# Patient Record
Sex: Female | Born: 1995 | Race: Black or African American | Hispanic: No | Marital: Single | Smoking: Never smoker
Health system: Southern US, Community
[De-identification: ages and names within clinical notes are randomized; demographics above are authoritative.]

## PROBLEM LIST (undated history)

## (undated) DIAGNOSIS — I10 Essential (primary) hypertension: Secondary | ICD-10-CM

## (undated) DIAGNOSIS — E119 Type 2 diabetes mellitus without complications: Secondary | ICD-10-CM

## (undated) DIAGNOSIS — Z789 Other specified health status: Secondary | ICD-10-CM

## (undated) HISTORY — DX: Type 2 diabetes mellitus without complications: E11.9

## (undated) HISTORY — PX: NO PAST SURGERIES: SHX2092

## (undated) HISTORY — DX: Essential (primary) hypertension: I10

---

## 2016-06-09 ENCOUNTER — Emergency Department
Admit: 2016-06-09 | Disposition: A | Discharge status: Home / Self Care | Source: Home / Self Care | Attending: Emergency Medicine | Admitting: Emergency Medicine

## 2016-06-09 LAB — HX .ABORH NTOF RECHECK
CASE NUMBER: 2017197000673
HX ABORH NTOF RECHECK: O POS — NL
HX ANTI-A: 0 — NL
HX ANTI-B: 0 — NL

## 2016-06-09 LAB — HX GLOMERULAR FILTRATION RATE (ESTIMATED)
CASE NUMBER: 2017197000673
HX AFN AMER GLOMERULAR FILTRATION RATE: >90
HX NON-AFN AMER GLOMERULAR FILTRATION RATE: >90

## 2016-06-09 LAB — HX .AUTOMATED DIFF
CASE NUMBER: 2017197000673
HX ABSOLUTE NEUTRO COUNT: 1840 /mm3
HX BASOPHILS: 0 % — NL (ref 0.0–1.0)
HX EOSINOPHILS: 2 % — NL (ref 0.0–3.0)
HX IMMATURE GRANULOCYTES: 0 % — NL (ref 0.0–2.0)
HX LYMPHOCYTES: 54 % — ABNORMAL HIGH (ref 22.0–40.0)
HX MONOCYTES: 9 % — NL (ref 0.0–11.0)
HX NEU CT MEASURED: 1.84
HX NEUTROPHILS: 36 % — ABNORMAL LOW (ref 40.0–71.0)

## 2016-06-09 LAB — HX .ABO/RH TYPE TUBE
CASE NUMBER: 2017197000673
HX ABORH: O POS — NL
HX ANTI-A: 0 — NL
HX ANTI-B: 0 — NL

## 2016-06-09 LAB — HX COMPREHENSIVE METABOLIC PANEL
CASE NUMBER: 2017197000673
HX ALBUMIN LVL: 3.9 g/dL — NL (ref 3.5–5.0)
HX ALKALINE PHOSPHATASE: 49 U/L — NL (ref 45.0–117.0)
HX ALT: 13 U/L — NL (ref 6.0–78.0)
HX ANION GAP: 8 — NL (ref 3.0–11.0)
HX AST: 14 U/L — NL (ref 6.0–40.0)
HX BILIRUBIN TOTAL: 0.7 mg/dL — NL (ref 0.2–1.0)
HX BUN: 6 mg/dL — ABNORMAL LOW (ref 7.0–18.0)
HX CALCIUM LVL: 9 mg/dL — NL (ref 8.5–10.1)
HX CHLORIDE: 105 mmol/L — NL (ref 98.0–107.0)
HX CO2: 23 mmol/L — NL (ref 21.0–32.0)
HX CREATININE: 0.608 mg/dL — NL (ref 0.55–1.3)
HX GLUCOSE LVL: 100 mg/dL — NL (ref 65.0–110.0)
HX POTASSIUM LVL: 3.6 mmol/L — NL (ref 3.6–5.2)
HX SODIUM LVL: 136 mmol/L — NL (ref 136.0–145.0)
HX TOTAL PROTEIN: 8.3 g/dL — ABNORMAL HIGH (ref 6.0–8.0)

## 2016-06-09 LAB — HX HCG QUANTITATIVE
CASE NUMBER: 2017197000673
HX HCG QUANT: 12451 m[IU]/mL — ABNORMAL HIGH (ref 0.0–3.0)

## 2016-06-09 LAB — HX URINE DIPSTICK W/REFLEX
CASE NUMBER: 2017197000671
HX UA BILIRUBIN: NEGATIVE — NL
HX UA GLUCOSE: NEGATIVE — NL
HX UA KETONES: NEGATIVE — NL
HX UA NITRITE: NEGATIVE — NL
HX UA PH: 7 — NL (ref 5.0–8.0)
HX UA PROTEIN: NEGATIVE — NL
HX UA RBC: 3 /HPF — NL (ref 0.0–3.0)
HX UA SPECIFIC GRAVITY: 1.009 — NL (ref 1.005–1.03)
HX UA SQUAMOUS EPITHELIAL: 1 /HPF — NL (ref 0.0–5.0)
HX UA UROBILINOGEN: NEGATIVE — NL
HX UA WBC: 13 /HPF — ABNORMAL HIGH (ref 0.0–5.0)

## 2016-06-09 LAB — HX CBC W/ DIFF
CASE NUMBER: 2017197000673
HX HCT: 35.8 % — ABNORMAL LOW (ref 36.0–47.0)
HX HGB: 12.9 g/dL — NL (ref 11.8–15.8)
HX MCH: 31 pg — NL (ref 27.0–31.0)
HX MCHC: 36 g/dL — NL (ref 32.0–36.0)
HX MCV: 86 fL — NL (ref 81.0–99.0)
HX MPV: 9 fL — NL (ref 7.4–11.5)
HX NRBC PERCENT: 0 % — NL
HX PLATELET: 229 10*3/uL — NL (ref 150.0–400.0)
HX RBC: 4.17 10*6/uL — NL (ref 3.6–5.0)
HX RDW: 13 % — NL (ref 11.5–14.5)
HX WBC: 5.2 10*3/uL — NL (ref 3.7–11.2)

## 2016-06-09 LAB — HX ZZANTIBODY SCREEN
CASE NUMBER: 2017197000673
HX ANTIBODY SCREEN: NEGATIVE — NL
HX G1: 0 — NL
HX G2: 0 — NL
HX G3: 0 — NL

## 2016-06-09 LAB — HX BLUE TOP TO HOLD: CASE NUMBER: 2017197000684

## 2016-06-18 ENCOUNTER — Ambulatory Visit: Payer: Medicaid Other | Admitting: Family Medicine

## 2016-06-24 ENCOUNTER — Ambulatory Visit

## 2016-06-26 ENCOUNTER — Emergency Department
Admit: 2016-06-26 | Disposition: A | Discharge status: Home / Self Care | Source: Home / Self Care | Attending: Nurse Practitioner | Admitting: Nurse Practitioner

## 2016-06-26 LAB — HX COMPREHENSIVE METABOLIC PANEL
CASE NUMBER: 2017214002792
HX ALBUMIN LVL: 3.5 g/dL — NL (ref 3.5–5.0)
HX ALKALINE PHOSPHATASE: 52 U/L — NL (ref 45.0–117.0)
HX ALT: 17 U/L — NL (ref 6.0–78.0)
HX ANION GAP: 8 — NL (ref 3.0–11.0)
HX AST: 17 U/L — NL (ref 6.0–40.0)
HX BILIRUBIN TOTAL: 0.4 mg/dL — NL (ref 0.2–1.0)
HX BUN: 15 mg/dL — NL (ref 7.0–18.0)
HX CALCIUM LVL: 8.8 mg/dL — NL (ref 8.5–10.1)
HX CHLORIDE: 105 mmol/L — NL (ref 98.0–107.0)
HX CO2: 25 mmol/L — NL (ref 21.0–32.0)
HX CREATININE: 0.956 mg/dL — NL (ref 0.55–1.3)
HX GLUCOSE LVL: 95 mg/dL — NL (ref 65.0–110.0)
HX POTASSIUM LVL: 4 mmol/L — NL (ref 3.6–5.2)
HX SODIUM LVL: 138 mmol/L — NL (ref 136.0–145.0)
HX TOTAL PROTEIN: 8 g/dL — NL (ref 6.0–8.0)

## 2016-06-26 LAB — HX URINE DIPSTICK W/REFLEX
CASE NUMBER: 2017214002793
HX UA BILIRUBIN: NEGATIVE — NL
HX UA GLUCOSE: NEGATIVE — NL
HX UA KETONES: NEGATIVE — NL
HX UA NITRITE: NEGATIVE — NL
HX UA PH: 7 — NL (ref 5.0–8.0)
HX UA RBC: 8 /HPF — ABNORMAL HIGH (ref 0.0–3.0)
HX UA SPECIFIC GRAVITY: 1.018 — NL (ref 1.005–1.03)
HX UA SQUAMOUS EPITHELIAL: 7 /HPF — ABNORMAL HIGH (ref 0.0–5.0)
HX UA UROBILINOGEN: NEGATIVE — NL
HX UA WBC: 11 /HPF — ABNORMAL HIGH (ref 0.0–5.0)

## 2016-06-26 LAB — HX .AUTOMATED DIFF
CASE NUMBER: 2017214002791
HX ABSOLUTE NEUTRO COUNT: 2410 /mm3
HX BASOPHILS: 0 % — NL (ref 0.0–1.0)
HX EOSINOPHILS: 3 % — NL (ref 0.0–3.0)
HX IMMATURE GRANULOCYTES: 0 % — NL (ref 0.0–2.0)
HX LYMPHOCYTES: 52 % — ABNORMAL HIGH (ref 22.0–40.0)
HX MONOCYTES: 8 % — NL (ref 0.0–11.0)
HX NEU CT MEASURED: 2.41
HX NEUTROPHILS: 36 % — ABNORMAL LOW (ref 40.0–71.0)

## 2016-06-26 LAB — HX LIPASE LEVEL
CASE NUMBER: 2017214002792
HX LIPASE LVL: 190 U/L — NL (ref 73.0–393.0)

## 2016-06-26 LAB — HX CBC W/ DIFF
CASE NUMBER: 2017214002791
HX HCT: 33.9 % — ABNORMAL LOW (ref 36.0–47.0)
HX HGB: 12.1 g/dL — NL (ref 11.8–15.8)
HX MCH: 31 pg — NL (ref 27.0–31.0)
HX MCHC: 35.7 g/dL — NL (ref 32.0–36.0)
HX MCV: 86 fL — NL (ref 81.0–99.0)
HX MPV: 8.6 fL — NL (ref 7.4–11.5)
HX NRBC PERCENT: 0 % — NL
HX PLATELET: 211 10*3/uL — NL (ref 150.0–400.0)
HX RBC: 3.93 10*6/uL — NL (ref 3.6–5.0)
HX RDW: 12.3 % — NL (ref 11.5–14.5)
HX WBC: 6.7 10*3/uL — NL (ref 3.7–11.2)

## 2016-06-26 LAB — HX HCG QUANTITATIVE
CASE NUMBER: 2017214002792
HX HCG QUANT: 4682 m[IU]/mL — ABNORMAL HIGH (ref 0.0–3.0)

## 2016-06-26 LAB — HX BLUE TOP TO HOLD: CASE NUMBER: 2017214002792

## 2016-06-26 LAB — HX GLOMERULAR FILTRATION RATE (ESTIMATED)
CASE NUMBER: 2017214002792
HX AFN AMER GLOMERULAR FILTRATION RATE: >90
HX NON-AFN AMER GLOMERULAR FILTRATION RATE: 85 mL/min/{1.73_m2}

## 2016-06-26 LAB — HX SST GOLD TUBE TO HOLD: CASE NUMBER: 2017214002792

## 2016-06-26 LAB — HX BB TUBE TO HOLD: CASE NUMBER: 2017214002792

## 2016-06-27 ENCOUNTER — Ambulatory Visit: Admitting: Registered Nurse

## 2016-06-27 ENCOUNTER — Emergency Department
Admit: 2016-06-27 | Disposition: A | Discharge status: Home / Self Care | Source: Home / Self Care | Attending: Physician Assistant | Admitting: Physician Assistant

## 2016-06-27 LAB — HX URINE DIPSTICK W/REFLEX
CASE NUMBER: 2017215002483
HX UA BILIRUBIN: NEGATIVE — NL
HX UA GLUCOSE: NEGATIVE — NL
HX UA KETONES: NEGATIVE — NL
HX UA NITRITE: NEGATIVE — NL
HX UA PH: 6 — NL (ref 5.0–8.0)
HX UA RBC: 4 /HPF — ABNORMAL HIGH (ref 0.0–3.0)
HX UA SPECIFIC GRAVITY: 1.016 — NL (ref 1.005–1.03)
HX UA SQUAMOUS EPITHELIAL: 10 /HPF — ABNORMAL HIGH (ref 0.0–5.0)
HX UA UROBILINOGEN: NEGATIVE — NL
HX UA WBC: 13 /HPF — ABNORMAL HIGH (ref 0.0–5.0)

## 2016-06-27 LAB — HX HCG QUALITATIVE URINE
CASE NUMBER: 2017215002483
HX HCG QUAL URINE: POSITIVE — NL

## 2016-06-27 LAB — HX HCG QUANTITATIVE
CASE NUMBER: 2017215001858
HX HCG QUANT: 4720 m[IU]/mL — ABNORMAL HIGH (ref 0.0–3.0)

## 2016-06-28 LAB — HX URINE CULTURE
CASE NUMBER: 2017214002840
HX F: 40000
HX P: NO GROWTH

## 2016-06-29 LAB — HX URINE CULTURE
CASE NUMBER: 2017215002609
HX F: 50000
HX P: NO GROWTH

## 2016-06-30 ENCOUNTER — Emergency Department
Admit: 2016-06-30 | Disposition: A | Discharge status: Home / Self Care | Source: Home / Self Care | Attending: Emergency Medicine | Admitting: Emergency Medicine

## 2016-06-30 LAB — HX COMPREHENSIVE METABOLIC PANEL
CASE NUMBER: 2017218000499
HX ALBUMIN LVL: 3.4 g/dL — ABNORMAL LOW (ref 3.5–5.0)
HX ALKALINE PHOSPHATASE: 57 U/L — NL (ref 45.0–117.0)
HX ALT: 16 U/L — NL (ref 6.0–78.0)
HX ANION GAP: 7 — NL (ref 3.0–11.0)
HX AST: 17 U/L — NL (ref 6.0–40.0)
HX BILIRUBIN TOTAL: 0.4 mg/dL — NL (ref 0.2–1.0)
HX BUN: 14 mg/dL — NL (ref 7.0–18.0)
HX CALCIUM LVL: 9.1 mg/dL — NL (ref 8.5–10.1)
HX CHLORIDE: 107 mmol/L — NL (ref 98.0–107.0)
HX CO2: 25 mmol/L — NL (ref 21.0–32.0)
HX CREATININE: 0.529 mg/dL — ABNORMAL LOW (ref 0.55–1.3)
HX GLUCOSE LVL: 94 mg/dL — NL (ref 65.0–110.0)
HX POTASSIUM LVL: 3.8 mmol/L — NL (ref 3.6–5.2)
HX SODIUM LVL: 139 mmol/L — NL (ref 136.0–145.0)
HX TOTAL PROTEIN: 7.5 g/dL — NL (ref 6.0–8.0)

## 2016-06-30 LAB — HX HCG QUANTITATIVE
CASE NUMBER: 2017218000499
HX HCG QUANT: 2584 m[IU]/mL — ABNORMAL HIGH (ref 0.0–3.0)

## 2016-06-30 LAB — HX .AUTOMATED DIFF
CASE NUMBER: 2017218000499
HX ABSOLUTE NEUTRO COUNT: 2050 /mm3
HX BASOPHILS: 0 % — NL (ref 0.0–1.0)
HX EOSINOPHILS: 2 % — NL (ref 0.0–3.0)
HX IMMATURE GRANULOCYTES: 0 % — NL (ref 0.0–2.0)
HX LYMPHOCYTES: 53 % — ABNORMAL HIGH (ref 22.0–40.0)
HX MONOCYTES: 9 % — NL (ref 0.0–11.0)
HX NEU CT MEASURED: 2.05
HX NEUTROPHILS: 36 % — ABNORMAL LOW (ref 40.0–71.0)

## 2016-06-30 LAB — HX CBC W/ DIFF
CASE NUMBER: 2017218000499
HX HCT: 32.5 % — ABNORMAL LOW (ref 36.0–47.0)
HX HGB: 11.7 g/dL — ABNORMAL LOW (ref 11.8–15.8)
HX MCH: 31 pg — NL (ref 27.0–31.0)
HX MCHC: 36 g/dL — NL (ref 32.0–36.0)
HX MCV: 86 fL — NL (ref 81.0–99.0)
HX MPV: 8.8 fL — NL (ref 7.4–11.5)
HX NRBC PERCENT: 0 % — NL
HX PLATELET: 195 10*3/uL — NL (ref 150.0–400.0)
HX RBC: 3.76 10*6/uL — NL (ref 3.6–5.0)
HX RDW: 12.3 % — NL (ref 11.5–14.5)
HX WBC: 5.7 10*3/uL — NL (ref 3.7–11.2)

## 2016-06-30 LAB — HX GLOMERULAR FILTRATION RATE (ESTIMATED)
CASE NUMBER: 2017218000499
HX AFN AMER GLOMERULAR FILTRATION RATE: >90
HX NON-AFN AMER GLOMERULAR FILTRATION RATE: >90

## 2016-07-01 ENCOUNTER — Ambulatory Visit: Admitting: Obstetrics & Gynecology

## 2016-07-01 NOTE — Op Note (Signed)
__________________________________________________________________________    OPERATIVE REPORT  DATE:  07/01/2016    PREOPERATIVE DIAGNOSIS:  Incomplete abortion.    POSTOPERATIVE DIAGNOSIS:  Incomplete abortion.    PROCEDURE:  Dilatation and curettage.    SURGEON:  Delfina Redwood, M.D.    ASSISTANT:  None.    ANESTHESIA:  General.    ESTIMATED BLOOD LOSS:  50 mL.    INTAKE:  700 mL of crystalloid.    OUTPUT:  150 mL of clear yellow urine.    FINDINGS:  The uterus was slightly anteverted mid-plane and there was a large  amount of tissue consistent with products of conception.    DESCRIPTION OF PROCEDURE:  The patient was taken to the Operating Room where she  was given general anesthetic.  She was then placed in the low lithotomy  position.  She was prepped and draped in the usual sterile fashion.  The urinary  bladder was straight catheterized for 150 mL of clear yellow urine.  A Sims  speculum was placed into the vagina.  The cervix was visualized and grasped on  its anterior lip with a single-tooth tenaculum.  The cervix was noted to be  already widely dilated with clot at the os.  A 20-Pratt dilator was gently  inserted and the cervix was already dilated beyond this.  After this, an 8-mm  curved suction curette was used to evacuate the contents of the uterus.  Two  passes were done.  After this, a very gentle sharp curettage was done, just to  feel that the surfaces felt cleared.  One additional pass with the suction was  then done and there was no additional tissue.  The tenaculum was removed.  The  site was noted to be hemostatic.  There was a very small amount of bleeding from  the os.  All instruments were withdrawn and the patient was awakened and taken  to Recovery in good condition.    Dictated by:  Delfina Redwood, M.D.    D:  07/01/2016 15:07:50  T:  07/01/2016 16:09:18  E:  07/01/2016 16:09:18  ME/jf  Job# [id number]   SIGNATURE LINE    Electronically signed by Vernie Ammons MD, Carrel Leather on 07/01/2016 at  16:26:21 EST

## 2016-07-03 LAB — HX SURG PATH FINAL REPORT: CASE NUMBER: 0

## 2016-07-18 ENCOUNTER — Ambulatory Visit

## 2016-07-18 LAB — HX HCG QUANTITATIVE
CASE NUMBER: 2017236000633
HX HCG QUANT: 3 m[IU]/mL — NL (ref 0.0–3.0)

## 2016-08-30 ENCOUNTER — Ambulatory Visit: Admitting: Registered Nurse

## 2016-08-30 LAB — HX COMPREHENSIVE METABOLIC PANEL
CASE NUMBER: 2017279002256
HX ALBUMIN LVL: 3.8 g/dL — NL (ref 3.5–5.0)
HX ALKALINE PHOSPHATASE: 61 U/L — NL (ref 45.0–117.0)
HX ALT: 18 U/L — NL (ref 6.0–78.0)
HX ANION GAP: 7 — NL (ref 3.0–11.0)
HX AST: 21 U/L — NL (ref 6.0–40.0)
HX BILIRUBIN TOTAL: 0.4 mg/dL — NL (ref 0.2–1.2)
HX BUN: 8 mg/dL — NL (ref 7.0–18.0)
HX CALCIUM LVL: 9.2 mg/dL — NL (ref 8.5–10.1)
HX CHLORIDE: 104 mmol/L — NL (ref 98.0–110.0)
HX CO2: 27 mmol/L — NL (ref 21.0–32.0)
HX CREATININE: 0.483 mg/dL — ABNORMAL LOW (ref 0.55–1.3)
HX GLUCOSE LVL: 87 mg/dL — NL (ref 65.0–110.0)
HX POTASSIUM LVL: 3.4 mmol/L — ABNORMAL LOW (ref 3.6–5.2)
HX SODIUM LVL: 138 mmol/L — NL (ref 136.0–145.0)
HX TOTAL PROTEIN: 8.6 g/dL — ABNORMAL HIGH (ref 6.0–8.0)

## 2016-08-30 LAB — HX HIV 1/2 ANTIGEN/ANTIBODY COMBINATION ASS
CASE NUMBER: 2017279002257
HX HIV-1/HIV-2 AG/AB COMBO SCREEN: NONREACTIVE

## 2016-08-30 LAB — HX GLOMERULAR FILTRATION RATE (ESTIMATED)
CASE NUMBER: 2017279002256
HX AFN AMER GLOMERULAR FILTRATION RATE: >90
HX NON-AFN AMER GLOMERULAR FILTRATION RATE: >90

## 2016-08-30 LAB — HX HEPATITIS C ANTIBODY
CASE NUMBER: 2017279002256
HX HEP C AB INST: NONREACTIVE
HX HEP C AB MEASURED: 0.07 — NL (ref 0.0–0.8)
HX HEP C AB: NONREACTIVE

## 2016-08-30 LAB — HX LIPID PANEL
CASE NUMBER: 2017279002256
HX CHOL: 141 mg/dL — NL (ref 140.0–200.0)
HX HDL: 59 mg/dL — NL (ref 41.0–60.0)
HX LDL: 43 mg/dL — NL (ref 0.0–129.0)
HX TRIG: 195 mg/dL — ABNORMAL HIGH (ref 0.0–149.0)

## 2016-08-31 LAB — HX HEMOGLOBIN A1C
CASE NUMBER: 2017279002256
HX EST AVERAGE GLUCOSE (EAG): 80 mg/dL
HX HEMOGLOBIN A1C: 4.4 % — NL
HX LA1C (INTERNAL): 1.6 % — NL
HX P3 PEAK (INTERNAL): 2.8 % — NL
HX P4 PEAK (INTERNAL): 1.2 % — NL
HX TOTAL AREA RANGE (INTERNAL): 1.73 microvolt/sec — NL (ref 1.0–3.5)

## 2016-09-01 LAB — HX RPR
CASE NUMBER: 2017279002256
HX RPR QUAL: NONREACTIVE — NL

## 2016-09-02 LAB — HX SEXUALLY TRAN DIS (AMP PRB)
CASE NUMBER: 2017279002258
HX C TRACHOMATIS DNA: NOT DETECTED — NL
HX N. GONORRHOEAE DNA: NOT DETECTED — NL
HX TOTAL RLU: 9

## 2016-09-21 ENCOUNTER — Emergency Department: Admit: 2016-09-21 | Disposition: A | Discharge status: Home / Self Care | Source: Home / Self Care

## 2016-09-21 LAB — HX CBC W/ DIFF
CASE NUMBER: 2017301000846
HX HCT: 34.7 % — ABNORMAL LOW (ref 36.0–47.0)
HX HGB: 12.2 g/dL — NL (ref 11.8–15.8)
HX MCH: 29 pg — NL (ref 27.0–31.0)
HX MCHC: 35.2 g/dL — NL (ref 32.0–36.0)
HX MCV: 84 fL — NL (ref 81.0–99.0)
HX MPV: 8.8 fL — NL (ref 7.4–11.5)
HX NRBC PERCENT: 0 % — NL
HX PLATELET: 249 10*3/uL — NL (ref 150.0–400.0)
HX RBC: 4.15 10*6/uL — NL (ref 3.6–5.0)
HX RDW: 12.4 % — NL (ref 11.5–14.5)
HX WBC: 4.8 10*3/uL — NL (ref 3.7–11.2)

## 2016-09-21 LAB — HX URINE DIPSTICK W/REFLEX
CASE NUMBER: 2017301000847
HX UA BILIRUBIN: NEGATIVE — NL
HX UA BLOOD: NEGATIVE — NL
HX UA GLUCOSE: NEGATIVE — NL
HX UA KETONES: NEGATIVE — NL
HX UA NITRITE: NEGATIVE — NL
HX UA PH: 7 — NL (ref 5.0–8.0)
HX UA PROTEIN: NEGATIVE — NL
HX UA RBC: 2 /HPF — NL (ref 0.0–3.0)
HX UA SPECIFIC GRAVITY: 1.016 — NL (ref 1.005–1.03)
HX UA SQUAMOUS EPITHELIAL: 9 /HPF — ABNORMAL HIGH (ref 0.0–5.0)
HX UA UROBILINOGEN: NEGATIVE — NL
HX UA WBC: 7 /HPF — ABNORMAL HIGH (ref 0.0–5.0)

## 2016-09-21 LAB — HX .AUTOMATED DIFF
CASE NUMBER: 2017301000846
HX ABSOLUTE NEUTRO COUNT: 1970 /mm3
HX BASOPHILS: 1 % — NL (ref 0.0–1.0)
HX EOSINOPHILS: 2 % — NL (ref 0.0–3.0)
HX IMMATURE GRANULOCYTES: 0 % — NL (ref 0.0–2.0)
HX LYMPHOCYTES: 48 % — ABNORMAL HIGH (ref 22.0–40.0)
HX MONOCYTES: 8 % — NL (ref 0.0–11.0)
HX NEU CT MEASURED: 1.97
HX NEUTROPHILS: 41 % — NL (ref 40.0–71.0)

## 2016-09-21 LAB — HX COMPREHENSIVE METABOLIC PANEL
CASE NUMBER: 2017301000850
HX ALBUMIN LVL: 3.5 g/dL — NL (ref 3.5–5.0)
HX ALKALINE PHOSPHATASE: 56 U/L — NL (ref 45.0–117.0)
HX ALT: 13 U/L — NL (ref 6.0–78.0)
HX ANION GAP: 7 — NL (ref 3.0–11.0)
HX AST: 18 U/L — NL (ref 6.0–40.0)
HX BILIRUBIN TOTAL: 0.4 mg/dL — NL (ref 0.2–1.2)
HX BUN: 10 mg/dL — NL (ref 7.0–18.0)
HX CALCIUM LVL: 8.8 mg/dL — NL (ref 8.5–10.1)
HX CHLORIDE: 105 mmol/L — NL (ref 98.0–110.0)
HX CO2: 25 mmol/L — NL (ref 21.0–32.0)
HX CREATININE: 0.52 mg/dL — ABNORMAL LOW (ref 0.55–1.3)
HX GLUCOSE LVL: 99 mg/dL — NL (ref 65.0–110.0)
HX POTASSIUM LVL: 3.9 mmol/L — NL (ref 3.6–5.2)
HX SODIUM LVL: 137 mmol/L — NL (ref 136.0–145.0)
HX TOTAL PROTEIN: 7.8 g/dL — NL (ref 6.0–8.0)

## 2016-09-21 LAB — HX C-REACTIVE PROTEIN (CRP)
CASE NUMBER: 2017301000846
HX C-REACTIVE PROTEIN: <0.3 — NL (ref 0.0–0.8)

## 2016-09-21 LAB — HX GLOMERULAR FILTRATION RATE (ESTIMATED)
CASE NUMBER: 2017301000850
HX AFN AMER GLOMERULAR FILTRATION RATE: >90
HX NON-AFN AMER GLOMERULAR FILTRATION RATE: >90

## 2016-09-21 LAB — HX SEDIMENTATION RATE
CASE NUMBER: 2017301000846
HX SED RATE: 42 mm/h — ABNORMAL HIGH (ref 0.0–20.0)

## 2016-09-21 LAB — HX HCG QUANTITATIVE
CASE NUMBER: 2017301000850
HX HCG QUANT: 44 m[IU]/mL — ABNORMAL HIGH (ref 0.0–3.0)

## 2016-10-04 ENCOUNTER — Ambulatory Visit: Admitting: Women's Health

## 2016-10-04 LAB — HX HCG QUANTITATIVE
CASE NUMBER: 2017314002176
HX HCG QUANT: 15797 m[IU]/mL — ABNORMAL HIGH (ref 0.0–3.0)

## 2016-10-11 ENCOUNTER — Ambulatory Visit: Admitting: Women's Health

## 2016-10-22 ENCOUNTER — Ambulatory Visit: Admitting: Women's Health

## 2016-10-23 LAB — HX URINE CULTURE
CASE NUMBER: 2017332002840
HX F: >100000

## 2016-11-04 ENCOUNTER — Emergency Department
Admit: 2016-11-04 | Disposition: A | Discharge status: Home / Self Care | Source: Home / Self Care | Attending: Nurse Practitioner | Admitting: Nurse Practitioner

## 2016-11-04 LAB — HX URINE DIPSTICK W/REFLEX
CASE NUMBER: 2017345002093
HX UA BILIRUBIN: NEGATIVE — NL
HX UA GLUCOSE: NEGATIVE — NL
HX UA KETONES: NEGATIVE — NL
HX UA NITRITE: NEGATIVE — NL
HX UA PH: 6 — NL (ref 5.0–8.0)
HX UA RBC: 10 /HPF — ABNORMAL HIGH (ref 0.0–3.0)
HX UA SPECIFIC GRAVITY: 1.033 — ABNORMAL HIGH (ref 1.005–1.03)
HX UA SQUAMOUS EPITHELIAL: 17 /HPF — ABNORMAL HIGH (ref 0.0–5.0)
HX UA UROBILINOGEN: 2 — AB
HX UA WBC: 9 /HPF — ABNORMAL HIGH (ref 0.0–5.0)

## 2016-11-04 LAB — HX COMPREHENSIVE METABOLIC PANEL
CASE NUMBER: 2017345002092
HX ALBUMIN LVL: 3.6 g/dL — NL (ref 3.5–5.0)
HX ALKALINE PHOSPHATASE: 48 U/L — NL (ref 45.0–117.0)
HX ALT: 111 U/L — ABNORMAL HIGH (ref 6.0–78.0)
HX ANION GAP: 8 — NL (ref 3.0–11.0)
HX AST: 85 U/L — ABNORMAL HIGH (ref 6.0–40.0)
HX BILIRUBIN TOTAL: 0.6 mg/dL — NL (ref 0.2–1.2)
HX BUN: 7 mg/dL — NL (ref 7.0–18.0)
HX CALCIUM LVL: 9.6 mg/dL — NL (ref 8.5–10.1)
HX CHLORIDE: 104 mmol/L — NL (ref 98.0–110.0)
HX CO2: 25 mmol/L — NL (ref 21.0–32.0)
HX CREATININE: 0.445 mg/dL — ABNORMAL LOW (ref 0.55–1.3)
HX GLUCOSE LVL: 75 mg/dL — NL (ref 65.0–110.0)
HX POTASSIUM LVL: 3.8 mmol/L — NL (ref 3.6–5.2)
HX SODIUM LVL: 137 mmol/L — NL (ref 136.0–145.0)
HX TOTAL PROTEIN: 8.6 g/dL — ABNORMAL HIGH (ref 6.0–8.0)

## 2016-11-04 LAB — HX .AUTOMATED DIFF
CASE NUMBER: 2017345002092
HX ABSOLUTE NEUTRO COUNT: 2790 /mm3
HX BASOPHILS: 0 % — NL (ref 0.0–1.0)
HX EOSINOPHILS: 1 % — NL (ref 0.0–3.0)
HX IMMATURE GRANULOCYTES: 0 % — NL (ref 0.0–2.0)
HX LYMPHOCYTES: 40 % — NL (ref 22.0–40.0)
HX MONOCYTES: 9 % — NL (ref 0.0–11.0)
HX NEU CT MEASURED: 2.79
HX NEUTROPHILS: 49 % — NL (ref 40.0–71.0)

## 2016-11-04 LAB — HX GLOMERULAR FILTRATION RATE (ESTIMATED)
CASE NUMBER: 2017345002092
HX AFN AMER GLOMERULAR FILTRATION RATE: >90
HX NON-AFN AMER GLOMERULAR FILTRATION RATE: >90

## 2016-11-04 LAB — HX CBC W/ DIFF
CASE NUMBER: 2017345002092
HX HCT: 37.1 % — NL (ref 36.0–47.0)
HX HGB: 12.8 g/dL — NL (ref 11.8–15.8)
HX MCH: 29 pg — NL (ref 27.0–31.0)
HX MCHC: 34.5 g/dL — NL (ref 32.0–36.0)
HX MCV: 84 fL — NL (ref 81.0–99.0)
HX MPV: 8.8 fL — NL (ref 7.4–11.5)
HX NRBC PERCENT: 0 % — NL
HX PLATELET: 282 10*3/uL — NL (ref 150.0–400.0)
HX RBC: 4.41 10*6/uL — NL (ref 3.6–5.0)
HX RDW: 13.5 % — NL (ref 11.5–14.5)
HX WBC: 5.6 10*3/uL — NL (ref 3.7–11.2)

## 2016-11-04 LAB — HX BLUE TOP TO HOLD: CASE NUMBER: 2017345002092

## 2016-11-04 NOTE — ED Notes (Signed)
Please click on link to see document

## 2016-11-06 LAB — HX URINE CULTURE
CASE NUMBER: 2017345002542
HX F: >100000
HX P: 30000

## 2016-11-14 ENCOUNTER — Ambulatory Visit: Admitting: Registered Nurse

## 2016-11-18 LAB — HX QUANTIFERON TB GOLD
CASE NUMBER: 2017355002435
HX QUANTIFERON NIL: 0.18 [IU]/mL
HX QUANTIFERON TB GOLD: NEGATIVE
HX QUANTIFERON TB MINUS NIL: 0.32 [IU]/mL
HX QUANTIFERON TB MITOGEN MINUS NIL: 3.78 [IU]/mL

## 2016-11-19 ENCOUNTER — Ambulatory Visit: Admitting: Registered Nurse

## 2016-11-19 LAB — HX CBC W/ INDICES
CASE NUMBER: 2017360002238
HX HCT: 33.9 % — ABNORMAL LOW (ref 36.0–47.0)
HX HGB: 11.9 g/dL — NL (ref 11.8–15.8)
HX MCH: 30 pg — NL (ref 27.0–31.0)
HX MCHC: 35.1 g/dL — NL (ref 32.0–36.0)
HX MCV: 86 fL — NL (ref 81.0–99.0)
HX MPV: 9.5 fL — NL (ref 7.4–11.5)
HX NRBC PERCENT: 0 % — NL
HX PLATELET: 254 10*3/uL — NL (ref 150.0–400.0)
HX RBC: 3.95 10*6/uL — NL (ref 3.6–5.0)
HX RDW: 13.3 % — NL (ref 11.5–14.5)
HX WBC: 5.4 10*3/uL — NL (ref 3.7–11.2)

## 2016-11-19 LAB — HX .ABO/RH TYPE TUBE
CASE NUMBER: 2017360002238
HX ABORH: O POS — NL
HX ANTI-A: 0 — NL
HX ANTI-B: 0 — NL

## 2016-11-19 LAB — HX HEPATITIS C ANTIBODY
CASE NUMBER: 2017360002238
HX HEP C AB INST: NONREACTIVE
HX HEP C AB MEASURED: 0.11 — NL (ref 0.0–0.8)
HX HEP C AB: NONREACTIVE

## 2016-11-19 LAB — HX ZZANTIBODY SCREEN
CASE NUMBER: 2017360002238
HX ANTIBODY SCREEN: NEGATIVE — NL
HX G1: 0 — NL
HX G2: 0 — NL
HX G3: 0 — NL

## 2016-11-19 LAB — HX RUBELLA ANTIBODY IGG
CASE NUMBER: 2017360002238
HX RUBELLA IGG: 51.9 [IU]/mL

## 2016-11-19 LAB — HX HEPATITIS B SURFACE ANTIGEN PRENATAL
CASE NUMBER: 2017360002238
HX HBSAG MEASURED: <0.1 — NL (ref 0.0–0.99)
HX HEP B SURFACE AG PRENATAL: NEGATIVE
HX HEP BS AG INST: NONREACTIVE

## 2016-11-19 LAB — HX HIV 1/2 ANTIGEN/ANTIBODY COMBINATION ASS
CASE NUMBER: 2017360002239
HX HIV-1/HIV-2 AG/AB COMBO SCREEN: NONREACTIVE

## 2016-11-20 LAB — HX SEXUALLY TRAN DIS (AMP PRB)
CASE NUMBER: 2017360002755
HX C TRACHOMATIS DNA: NOT DETECTED — NL
HX N. GONORRHOEAE DNA: NOT DETECTED — NL
HX TOTAL RLU: 7

## 2016-11-20 LAB — HX RPR
CASE NUMBER: 2017360002238
HX RPR QUAL: NONREACTIVE — NL

## 2016-11-21 LAB — HX ZZYYDRUGS OF ABUSE PANEL 9, URINE
CASE NUMBER: 2017360002755
HX U AMPHETAMINES SCR: NEGATIVE
HX U METHADONE SCR: NEGATIVE
HX URINE BARBITURATES SCREEN: NEGATIVE
HX URINE BENZODIAZEPINES SCREE: NEGATIVE
HX URINE CANNABINOID SCREEN: NEGATIVE
HX URINE COCAINE SCREEN: NEGATIVE
HX URINE CREAT (DAU): 299.6 mg/dL
HX URINE OPIATES SCREEN: NEGATIVE
HX URINE PHENCYCLIDINE SCREEN: NEGATIVE
HX URINE PROPOXYPHENE SCREEN: NEGATIVE

## 2016-12-17 ENCOUNTER — Ambulatory Visit: Admitting: Registered Nurse

## 2016-12-19 LAB — HX AFP QUAD SCREEN
CASE NUMBER: 2018023002984
HX DIMER INHIBIN A: 113 pg/mL
HX GEST AGE (EXACT): 15.71
HX MATERNAL AFP: 54 ng/mL
HX MATERNAL AGE AT DELIVERY: 23.5
HX MATERNAL SCREEN INTERPRETATION: NORMAL
HX MATERNAL WEIGHT: 141
HX MOM DIA: 0.68
HX MOM FOR AFP: 1.5
HX MOM FOR HCG: 1.4
HX MOM FOR UNCONJUGATED E3: 0.96
HX PATIENT'S HCG: 60140 [IU]/L
HX UNCONJUGATED E3: 0.79 ng/mL

## 2017-01-08 ENCOUNTER — Ambulatory Visit: Admitting: Registered Nurse

## 2017-02-11 ENCOUNTER — Ambulatory Visit: Admitting: Women's Health

## 2017-03-13 ENCOUNTER — Ambulatory Visit: Admitting: Obstetrics & Gynecology

## 2017-03-13 LAB — HX CBC W/ DIFF
CASE NUMBER: 2018109002387
HX HCT: 27.6 % — ABNORMAL LOW (ref 36.0–47.0)
HX HGB: 9.3 g/dL — ABNORMAL LOW (ref 11.8–15.8)
HX MCH: 31 pg — NL (ref 27.0–31.0)
HX MCHC: 33.7 g/dL — NL (ref 32.0–36.0)
HX MCV: 92 fL — NL (ref 81.0–99.0)
HX MPV: 9.9 fL — NL (ref 7.4–11.5)
HX NRBC PERCENT: 0 % — NL
HX PLATELET: 213 10*3/uL — NL (ref 150.0–400.0)
HX RBC: 2.99 10*6/uL — ABNORMAL LOW (ref 3.6–5.0)
HX RDW: 13.5 % — NL (ref 11.5–14.5)
HX WBC: 6.1 10*3/uL — NL (ref 3.7–11.2)

## 2017-03-13 LAB — HX .AUTOMATED DIFF
CASE NUMBER: 2018109002387
HX ABSOLUTE NEUTRO COUNT: 3460 /mm3
HX BASOPHILS: 0 % — NL (ref 0.0–1.0)
HX EOSINOPHILS: 2 % — NL (ref 0.0–3.0)
HX IMMATURE GRANULOCYTES: 1 % — NL (ref 0.0–2.0)
HX LYMPHOCYTES: 32 % — NL (ref 22.0–40.0)
HX MONOCYTES: 9 % — NL (ref 0.0–11.0)
HX NEU CT MEASURED: 3.46
HX NEUTROPHILS: 57 % — NL (ref 40.0–71.0)

## 2017-03-13 LAB — HX .GLU 1 HR PRENATAL 50 G
CASE NUMBER: 2018109002387
HX GLUC 1 HR PRENATAL: 99 mg/dL — NL (ref 65.0–129.0)

## 2017-03-13 LAB — HX HIV 1/2 ANTIGEN/ANTIBODY COMBINATION ASS
CASE NUMBER: 2018109002388
HX HIV-1/HIV-2 AG/AB COMBO SCREEN: NONREACTIVE

## 2017-05-09 ENCOUNTER — Ambulatory Visit: Admitting: Internal Medicine

## 2017-05-11 LAB — HX VAG/REC GROUP B BY PCR
CASE NUMBER: 2018166002607
HX GROUP B STREP BY PCR: DETECTED — AB

## 2017-06-03 ENCOUNTER — Ambulatory Visit

## 2017-06-03 ENCOUNTER — Inpatient Hospital Stay
Admit: 2017-06-03 | Disposition: A | Discharge status: Home / Self Care | Source: Ambulatory Visit | Attending: Addiction Medicine | Admitting: Addiction Medicine

## 2017-06-03 LAB — HX .ABO/RH TYPE TUBE
CASE NUMBER: 2018191000293
HX ABORH: O POS — NL
HX ANTI-A: 0 — NL
HX ANTI-B: 0 — NL

## 2017-06-03 LAB — HX ZZANTIBODY SCREEN
CASE NUMBER: 2018191000293
HX ANTIBODY SCREEN: NEGATIVE — NL
HX G1: 0 — NL
HX G2: 0 — NL
HX G3: 0 — NL

## 2017-06-03 LAB — HX CBC W/ INDICES
CASE NUMBER: 2018191000293
HX HCT: 33.9 % — ABNORMAL LOW (ref 36.0–47.0)
HX HGB: 11.2 g/dL — ABNORMAL LOW (ref 11.8–15.8)
HX MCH: 29 pg — NL (ref 27.0–31.0)
HX MCHC: 33 g/dL — NL (ref 32.0–36.0)
HX MCV: 87 fL — NL (ref 81.0–99.0)
HX MPV: 9.4 fL — NL (ref 7.4–11.5)
HX NRBC PERCENT: 0 % — NL
HX PLATELET: 260 10*3/uL — NL (ref 150.0–400.0)
HX RBC: 3.91 10*6/uL — NL (ref 3.6–5.0)
HX RDW: 14.6 % — ABNORMAL HIGH (ref 11.5–14.5)
HX WBC: 7.3 10*3/uL — NL (ref 3.7–11.2)

## 2017-06-03 NOTE — H&P (Signed)
__________________________________________________________________________    HISTORY AND PHYSICAL  ADMITTED:  06/03/2017    CHIEF COMPLAINT:  "I am having contractions."    HISTORY OF PRESENT ILLNESS:  This is a 21 year old G2 P0-0-1-0 at 39 weeks and 5  days admitted in early labor, 3.5 cm of cervical dilation, 100% effaced and 0  station.  Endorses fetal movement.  Endorses contractions and denies leakage of  fluid or vaginal bleeding.  Pregnancy complicated.    Obstetric Issues:  1.  Hyperemesis.  2.  Labial lesion that has resolved.  She had a Bartholin's cyst that was  drained during the pregnancy and the patient was started on antibiotics in  March, currently has resolved.  3.  Anemia.  The patient taking iron and Colace.  4.  SAB.  Second pregnancy is current.    GYN HISTORY:  Denies.    FAMILY HISTORY:  Noncontributory.    PAST SURGICAL HISTORY:  None.    SOCIAL HISTORY:  Denies alcohol or tobacco use.    ALLERGIES:  No known drug allergies.    MEDICATIONS:  1.  Prenatal vitamins.  2.  Iron.  3.  Colace.    REVIEW OF SYSTEMS:  Unremarkable.    PHYSICAL EXAMINATION:  VITAL SIGNS:  Stable.  Afebrile.    CHEST:  Lungs clear to auscultation bilaterally.    HEART:  S1 and S2 normal.    ABDOMEN:  Soft, nontender and nondistended.    PELVIC:  3.5 cm dilated and 70% effaced, 0 station, vertex intact.  Fetal heart  tracing category 1, contractions two to three minutes.  Adequate pelvis.   Estimated fetal weight 3500 grams.    LABORATORY DATA:  HIV negative.  Hepatitis B surface antigen negative.  Rubella  immune.  RPR nonreactive.  GBS positive, O positive blood type.  Hemoglobin  11.2.  Hematocrit 33.9.  Platelets 260.  One hour OGTT passed.  Quad screen  normal.  Gonorrhea and chlamydia negative.  GOLD QuantiFERON negative.    IMPRESSION:  This is a 21 year old G2 P0-0-1-0 at 39 weeks and 5 days in early  labor.    PLAN:    1.  Pitocin if needed and epidural if required.  2.  Expectant management for  now.    Dictated by:  Debara Pickett, M.D.    D:  06/03/2017 07:56:39  T:  06/03/2017 16:10:96  E:  06/03/2017 04:54:09  PDK/par  Job# [id number]   SIGNATURE LINE    Electronically signed by Marcello Moores MD, Etsuko Dierolf on 06/04/2017 at 09:54:15 EST

## 2017-06-04 LAB — HX BLOOD GAS ARTERIAL CORD
CASE NUMBER: 2018192000455
HX BASE EXCESS ARTERIAL CORD: -5.3 mmol/L — NL
HX FIO2 CONC: 21
HX HCO3 ARTERIAL CORD: 17.1 mmol/L — NL (ref 17.0–24.0)
HX O2 SAT ARTERIAL CORD: 12.3 % — NL (ref 7.0–21.0)
HX PCO2 ARTERIAL CORD: 62 mmHg — ABNORMAL HIGH (ref 33.0–49.0)
HX PH ARTERIAL CORD: 7.18 — ABNORMAL LOW (ref 7.21–7.31)
HX PO2 ARTERIAL CORD: <13 — NL (ref 9.0–19.0)

## 2017-06-04 LAB — HX BLOOD GAS VENOUS
CASE NUMBER: 2018192000454
HX FIO2 CONC: 21
HX HCO3 VENOUS: 17.8 mmol/L — ABNORMAL LOW (ref 22.0–26.0)
HX O2 SAT VENOUS: 43.2 % — ABNORMAL LOW (ref 50.0–70.0)
HX PCO2 VENOUS: 44.2 mmHg — NL (ref 41.0–51.0)
HX PH VENOUS: 7.267 — ABNORMAL LOW (ref 7.32–7.42)
HX PO2 VENOUS: <10

## 2017-06-05 LAB — HX .AUTOMATED DIFF
CASE NUMBER: 2018193000360
HX ABSOLUTE NEUTRO COUNT: 10510 /mm3
HX BASOPHILS: 0 % — NL (ref 0.0–1.0)
HX EOSINOPHILS: 0 % — NL (ref 0.0–3.0)
HX IMMATURE GRANULOCYTES: 1 % — NL (ref 0.0–2.0)
HX LYMPHOCYTES: 21 % — ABNORMAL LOW (ref 22.0–40.0)
HX MONOCYTES: 6 % — NL (ref 0.0–11.0)
HX NEU CT MEASURED: 10.51
HX NEUTROPHILS: 73 % — ABNORMAL HIGH (ref 40.0–71.0)

## 2017-06-05 LAB — HX CBC W/ DIFF
CASE NUMBER: 2018193000360
HX HCT: 24.2 % — ABNORMAL LOW (ref 36.0–47.0)
HX HGB: 7.9 g/dL — ABNORMAL LOW (ref 11.8–15.8)
HX MCH: 28 pg — NL (ref 27.0–31.0)
HX MCHC: 32.6 g/dL — NL (ref 32.0–36.0)
HX MCV: 87 fL — NL (ref 81.0–99.0)
HX MPV: 9.3 fL — NL (ref 7.4–11.5)
HX NRBC PERCENT: 0 % — NL
HX PLATELET: 203 10*3/uL — NL (ref 150.0–400.0)
HX RBC: 2.77 10*6/uL — ABNORMAL LOW (ref 3.6–5.0)
HX RDW: 14.8 % — ABNORMAL HIGH (ref 11.5–14.5)
HX WBC: 14.5 10*3/uL — ABNORMAL HIGH (ref 3.7–11.2)

## 2017-06-06 LAB — HX SURG PATH FINAL REPORT
CASE NUMBER: 0
HX NOTE: 88307

## 2017-06-10 ENCOUNTER — Ambulatory Visit: Admitting: Family

## 2017-06-10 LAB — HX CBC W/ DIFF
CASE NUMBER: 2018198002270
HX HCT: 25.8 % — ABNORMAL LOW (ref 36.0–47.0)
HX HGB: 8.4 g/dL — ABNORMAL LOW (ref 11.8–15.8)
HX MCH: 29 pg — NL (ref 27.0–31.0)
HX MCHC: 32.6 g/dL — NL (ref 32.0–36.0)
HX MCV: 88 fL — NL (ref 81.0–99.0)
HX MPV: 9.1 fL — NL (ref 7.4–11.5)
HX NRBC PERCENT: 0 % — NL
HX PLATELET: 328 10*3/uL — NL (ref 150.0–400.0)
HX RBC: 2.93 10*6/uL — ABNORMAL LOW (ref 3.6–5.0)
HX RDW: 14.4 % — NL (ref 11.5–14.5)
HX WBC: 8.3 10*3/uL — NL (ref 3.7–11.2)

## 2017-06-10 LAB — HX COMPREHENSIVE METABOLIC PANEL
CASE NUMBER: 2018198002270
HX ALBUMIN LVL: 2.7 g/dL — ABNORMAL LOW (ref 3.5–5.0)
HX ALKALINE PHOSPHATASE: 118 U/L — ABNORMAL HIGH (ref 45.0–117.0)
HX ALT: 20 U/L — NL (ref 6.0–78.0)
HX ANION GAP: 8 — NL (ref 3.0–11.0)
HX AST: 26 U/L — NL (ref 6.0–40.0)
HX BILIRUBIN TOTAL: 0.4 mg/dL — NL (ref 0.2–1.2)
HX BUN: 6 mg/dL — ABNORMAL LOW (ref 7.0–18.0)
HX CALCIUM LVL: 8.9 mg/dL — NL (ref 8.5–10.1)
HX CHLORIDE: 106 mmol/L — NL (ref 98.0–110.0)
HX CO2: 25 mmol/L — NL (ref 21.0–32.0)
HX CREATININE: 0.411 mg/dL — ABNORMAL LOW (ref 0.55–1.3)
HX GLUCOSE LVL: 63 mg/dL — ABNORMAL LOW (ref 65.0–110.0)
HX POTASSIUM LVL: 4 mmol/L — NL (ref 3.6–5.2)
HX SODIUM LVL: 139 mmol/L — NL (ref 136.0–145.0)
HX TOTAL PROTEIN: 7.1 g/dL — NL (ref 6.0–8.0)

## 2017-06-10 LAB — HX GLOMERULAR FILTRATION RATE (ESTIMATED)
CASE NUMBER: 2018198002270
HX AFN AMER GLOMERULAR FILTRATION RATE: >90
HX NON-AFN AMER GLOMERULAR FILTRATION RATE: >90

## 2017-06-10 LAB — HX .AUTOMATED DIFF
CASE NUMBER: 2018198002270
HX ABSOLUTE NEUTRO COUNT: 4430 /mm3
HX BASOPHILS: 0 % — NL (ref 0.0–1.0)
HX EOSINOPHILS: 1 % — NL (ref 0.0–3.0)
HX IMMATURE GRANULOCYTES: 1 % — NL (ref 0.0–2.0)
HX LYMPHOCYTES: 37 % — NL (ref 22.0–40.0)
HX MONOCYTES: 8 % — NL (ref 0.0–11.0)
HX NEU CT MEASURED: 4.43
HX NEUTROPHILS: 54 % — NL (ref 40.0–71.0)

## 2017-06-10 LAB — HX PROTEIN/ CREATININE RATIO
CASE NUMBER: 2018198002271
HX U CREAT: 97.1 mg/dL
HX U PROT/CR RATIO: 0.47
HX U PROTEIN: 45.2 mg/dL

## 2017-10-24 DIAGNOSIS — O26892 Other specified pregnancy related conditions, second trimester: Secondary | ICD-10-CM | POA: Insufficient documentation

## 2017-10-24 DIAGNOSIS — R1030 Lower abdominal pain, unspecified: Secondary | ICD-10-CM | POA: Insufficient documentation

## 2017-10-24 DIAGNOSIS — Z3A19 19 weeks gestation of pregnancy: Secondary | ICD-10-CM | POA: Insufficient documentation

## 2017-10-24 HISTORY — DX: Other specified health status: Z78.9

## 2017-10-24 LAB — URINALYSIS, ROUTINE W REFLEX MICROSCOPIC
GLUCOSE, UA: NEGATIVE mg/dL
HGB URINE DIPSTICK: NEGATIVE
PH: 7 (ref 5.0–8.0)
SPECIFIC GRAVITY, URINE: 1.013 (ref 1.005–1.030)

## 2017-10-24 LAB — WET PREP, GENITAL
CLUE CELLS WET PREP: NONE SEEN
SPERM: NONE SEEN

## 2017-10-24 LAB — DIFFERENTIAL
EOS PCT: 1 %
LYMPHS ABS: 2 10*3/uL (ref 0.7–4.0)
LYMPHS PCT: 32 %
MONO ABS: 0.2 10*3/uL (ref 0.1–1.0)
NEUTROS ABS: 3.9 10*3/uL (ref 1.7–7.7)

## 2017-10-24 LAB — CBC
HEMATOCRIT: 36.3 % (ref 36.0–46.0)
HEMOGLOBIN: 12.3 g/dL (ref 12.0–15.0)
MCH: 31.4 pg (ref 26.0–34.0)
MCV: 92.6 fL (ref 78.0–100.0)
RBC: 3.92 MIL/uL (ref 3.87–5.11)

## 2017-10-25 LAB — RUBELLA SCREEN: RUBELLA: 2.16 {index} (ref >0.99)

## 2017-10-26 DIAGNOSIS — Z3400 Encounter for supervision of normal first pregnancy, unspecified trimester: Secondary | ICD-10-CM | POA: Insufficient documentation

## 2017-10-27 LAB — GC/CHLAMYDIA PROBE AMP (~~LOC~~) NOT AT ARMC
CHLAMYDIA, DNA PROBE: NEGATIVE
NEISSERIA GONORRHEA: NEGATIVE

## 2018-03-09 DIAGNOSIS — O134 Gestational [pregnancy-induced] hypertension without significant proteinuria, complicating childbirth: Principal | ICD-10-CM | POA: Diagnosis present

## 2018-03-09 DIAGNOSIS — Z3A39 39 weeks gestation of pregnancy: Secondary | ICD-10-CM

## 2018-03-09 DIAGNOSIS — O368190 Decreased fetal movements, unspecified trimester, not applicable or unspecified: Secondary | ICD-10-CM

## 2018-03-09 DIAGNOSIS — O139 Gestational [pregnancy-induced] hypertension without significant proteinuria, unspecified trimester: Secondary | ICD-10-CM | POA: Diagnosis present

## 2018-03-09 DIAGNOSIS — O43123 Velamentous insertion of umbilical cord, third trimester: Secondary | ICD-10-CM | POA: Diagnosis present

## 2018-03-09 DIAGNOSIS — O368130 Decreased fetal movements, third trimester, not applicable or unspecified: Secondary | ICD-10-CM | POA: Diagnosis present

## 2018-03-09 DIAGNOSIS — Z8759 Personal history of other complications of pregnancy, childbirth and the puerperium: Secondary | ICD-10-CM

## 2018-03-10 DIAGNOSIS — Z8759 Personal history of other complications of pregnancy, childbirth and the puerperium: Secondary | ICD-10-CM | POA: Diagnosis present

## 2018-03-10 DIAGNOSIS — Z3A39 39 weeks gestation of pregnancy: Secondary | ICD-10-CM | POA: Diagnosis not present

## 2018-03-10 DIAGNOSIS — O368130 Decreased fetal movements, third trimester, not applicable or unspecified: Secondary | ICD-10-CM | POA: Diagnosis present

## 2018-03-10 DIAGNOSIS — O43123 Velamentous insertion of umbilical cord, third trimester: Secondary | ICD-10-CM | POA: Diagnosis present

## 2018-03-10 DIAGNOSIS — O134 Gestational [pregnancy-induced] hypertension without significant proteinuria, complicating childbirth: Secondary | ICD-10-CM | POA: Diagnosis present

## 2018-03-10 DIAGNOSIS — O139 Gestational [pregnancy-induced] hypertension without significant proteinuria, unspecified trimester: Secondary | ICD-10-CM | POA: Diagnosis present

## 2018-03-10 LAB — PROTEIN / CREATININE RATIO, URINE
CREATININE, URINE: 56 mg/dL
TOTAL PROTEIN, URINE: 14 mg/dL

## 2018-03-10 LAB — COMPREHENSIVE METABOLIC PANEL
CHLORIDE: 106 mmol/L (ref 101–111)
CO2: 17 mmol/L — AB (ref 22–32)
POTASSIUM: 3.7 mmol/L (ref 3.5–5.1)
SODIUM: 134 mmol/L — AB (ref 135–145)

## 2018-03-10 LAB — TYPE AND SCREEN
ABO/RH(D): O POS
ANTIBODY SCREEN: NEGATIVE

## 2018-03-10 LAB — CBC
MCH: 31.8 pg (ref 26.0–34.0)
MCHC: 35.8 g/dL (ref 30.0–36.0)
PLATELETS: 184 10*3/uL (ref 150–400)

## 2018-03-10 LAB — RPR: RPR: NONREACTIVE

## 2018-03-10 MED ADMIN — Lactated Ringer's Solution: INTRAVENOUS | @ 07:00:00 | NDC 00338011704

## 2018-03-10 MED ADMIN — Lactated Ringer's Solution: INTRAVENOUS | @ 15:00:00 | NDC 00338011704

## 2018-03-10 MED ADMIN — Lactated Ringer's Solution: INTRAVENOUS | @ 02:00:00 | NDC 00338011704

## 2018-03-11 DIAGNOSIS — O134 Gestational [pregnancy-induced] hypertension without significant proteinuria, complicating childbirth: Secondary | ICD-10-CM

## 2018-03-11 DIAGNOSIS — Z8759 Personal history of other complications of pregnancy, childbirth and the puerperium: Secondary | ICD-10-CM

## 2018-03-11 DIAGNOSIS — Z3A39 39 weeks gestation of pregnancy: Secondary | ICD-10-CM

## 2018-03-11 MED ADMIN — Lactated Ringer's Solution: INTRAVENOUS | @ 04:00:00 | NDC 00338011704

## 2018-03-11 MED ADMIN — Lactated Ringer's Solution: INTRAVENOUS | @ 09:00:00 | NDC 00338011704

## 2018-03-11 MED ADMIN — Misoprostol Tab 200 MCG: 800 ug | RECTAL

## 2018-09-30 ENCOUNTER — Encounter (HOSPITAL_COMMUNITY): Payer: Self-pay | Admitting: Emergency Medicine

## 2018-09-30 DIAGNOSIS — R21 Rash and other nonspecific skin eruption: Secondary | ICD-10-CM

## 2019-04-21 ENCOUNTER — Encounter (HOSPITAL_COMMUNITY): Payer: Self-pay | Admitting: Emergency Medicine

## 2019-07-30 ENCOUNTER — Encounter (HOSPITAL_COMMUNITY): Payer: Self-pay

## 2019-07-30 DIAGNOSIS — G44209 Tension-type headache, unspecified, not intractable: Secondary | ICD-10-CM | POA: Diagnosis not present

## 2019-07-30 DIAGNOSIS — Z79899 Other long term (current) drug therapy: Secondary | ICD-10-CM | POA: Diagnosis not present

## 2019-07-30 DIAGNOSIS — R51 Headache: Secondary | ICD-10-CM | POA: Diagnosis present

## 2019-12-08 ENCOUNTER — Encounter (HOSPITAL_COMMUNITY): Payer: Self-pay | Admitting: Emergency Medicine

## 2019-12-08 DIAGNOSIS — R519 Headache, unspecified: Secondary | ICD-10-CM | POA: Diagnosis present

## 2019-12-08 DIAGNOSIS — Z5321 Procedure and treatment not carried out due to patient leaving prior to being seen by health care provider: Secondary | ICD-10-CM | POA: Insufficient documentation

## 2019-12-10 ENCOUNTER — Encounter (HOSPITAL_COMMUNITY): Payer: Self-pay | Admitting: Emergency Medicine

## 2019-12-10 DIAGNOSIS — R0981 Nasal congestion: Secondary | ICD-10-CM

## 2019-12-10 DIAGNOSIS — R519 Headache, unspecified: Secondary | ICD-10-CM | POA: Diagnosis not present

## 2019-12-10 DIAGNOSIS — U071 COVID-19: Secondary | ICD-10-CM

## 2020-05-03 ENCOUNTER — Encounter (HOSPITAL_COMMUNITY): Payer: Self-pay

## 2020-05-03 DIAGNOSIS — R05 Cough: Secondary | ICD-10-CM | POA: Diagnosis not present

## 2020-05-03 DIAGNOSIS — J3089 Other allergic rhinitis: Secondary | ICD-10-CM

## 2020-05-03 DIAGNOSIS — R059 Cough, unspecified: Secondary | ICD-10-CM

## 2020-07-11 DIAGNOSIS — Z20822 Contact with and (suspected) exposure to covid-19: Secondary | ICD-10-CM | POA: Insufficient documentation

## 2020-07-11 DIAGNOSIS — Z01812 Encounter for preprocedural laboratory examination: Secondary | ICD-10-CM | POA: Insufficient documentation

## 2020-07-18 ENCOUNTER — Encounter (HOSPITAL_COMMUNITY): Payer: Self-pay

## 2020-07-18 DIAGNOSIS — Z20822 Contact with and (suspected) exposure to covid-19: Secondary | ICD-10-CM | POA: Insufficient documentation

## 2021-02-13 DIAGNOSIS — E119 Type 2 diabetes mellitus without complications: Secondary | ICD-10-CM

## 2021-02-13 DIAGNOSIS — Z136 Encounter for screening for cardiovascular disorders: Secondary | ICD-10-CM

## 2021-02-13 DIAGNOSIS — O24119 Pre-existing diabetes mellitus, type 2, in pregnancy, unspecified trimester: Secondary | ICD-10-CM

## 2021-02-13 DIAGNOSIS — O0992 Supervision of high risk pregnancy, unspecified, second trimester: Secondary | ICD-10-CM | POA: Insufficient documentation

## 2021-02-13 DIAGNOSIS — O169 Unspecified maternal hypertension, unspecified trimester: Secondary | ICD-10-CM

## 2021-02-13 DIAGNOSIS — Z3A00 Weeks of gestation of pregnancy not specified: Secondary | ICD-10-CM

## 2021-02-13 DIAGNOSIS — O0990 Supervision of high risk pregnancy, unspecified, unspecified trimester: Secondary | ICD-10-CM

## 2021-02-22 ENCOUNTER — Encounter: Payer: Self-pay | Admitting: Obstetrics and Gynecology

## 2021-02-22 ENCOUNTER — Encounter: Payer: Self-pay | Admitting: Family Medicine

## 2021-02-22 DIAGNOSIS — N879 Dysplasia of cervix uteri, unspecified: Secondary | ICD-10-CM | POA: Insufficient documentation

## 2021-02-22 DIAGNOSIS — Z8759 Personal history of other complications of pregnancy, childbirth and the puerperium: Secondary | ICD-10-CM

## 2021-02-22 DIAGNOSIS — O0932 Supervision of pregnancy with insufficient antenatal care, second trimester: Secondary | ICD-10-CM | POA: Insufficient documentation

## 2021-02-22 DIAGNOSIS — O24112 Pre-existing diabetes mellitus, type 2, in pregnancy, second trimester: Secondary | ICD-10-CM | POA: Insufficient documentation

## 2021-02-22 DIAGNOSIS — O10919 Unspecified pre-existing hypertension complicating pregnancy, unspecified trimester: Secondary | ICD-10-CM

## 2021-02-22 DIAGNOSIS — O0992 Supervision of high risk pregnancy, unspecified, second trimester: Secondary | ICD-10-CM | POA: Insufficient documentation

## 2021-02-22 DIAGNOSIS — Z789 Other specified health status: Secondary | ICD-10-CM

## 2021-02-22 DIAGNOSIS — Z3A24 24 weeks gestation of pregnancy: Secondary | ICD-10-CM | POA: Insufficient documentation

## 2021-02-22 DIAGNOSIS — Z01419 Encounter for gynecological examination (general) (routine) without abnormal findings: Secondary | ICD-10-CM | POA: Insufficient documentation

## 2021-02-22 DIAGNOSIS — O0990 Supervision of high risk pregnancy, unspecified, unspecified trimester: Secondary | ICD-10-CM

## 2021-02-22 HISTORY — DX: Dysplasia of cervix uteri, unspecified: N87.9

## 2021-02-23 DIAGNOSIS — Z1329 Encounter for screening for other suspected endocrine disorder: Secondary | ICD-10-CM

## 2021-02-28 ENCOUNTER — Encounter: Payer: Self-pay | Admitting: Obstetrics and Gynecology

## 2021-02-28 ENCOUNTER — Encounter: Payer: Self-pay | Admitting: Lactation Services

## 2021-02-28 DIAGNOSIS — R7989 Other specified abnormal findings of blood chemistry: Secondary | ICD-10-CM | POA: Insufficient documentation

## 2021-02-28 DIAGNOSIS — O0992 Supervision of high risk pregnancy, unspecified, second trimester: Secondary | ICD-10-CM

## 2021-02-28 DIAGNOSIS — E038 Other specified hypothyroidism: Secondary | ICD-10-CM | POA: Insufficient documentation

## 2021-03-01 ENCOUNTER — Encounter: Payer: Self-pay | Admitting: *Deleted

## 2021-03-01 DIAGNOSIS — O0992 Supervision of high risk pregnancy, unspecified, second trimester: Secondary | ICD-10-CM

## 2021-03-01 DIAGNOSIS — O0990 Supervision of high risk pregnancy, unspecified, unspecified trimester: Secondary | ICD-10-CM

## 2021-03-01 DIAGNOSIS — Z3A25 25 weeks gestation of pregnancy: Secondary | ICD-10-CM

## 2021-03-01 DIAGNOSIS — O132 Gestational [pregnancy-induced] hypertension without significant proteinuria, second trimester: Secondary | ICD-10-CM

## 2021-03-01 DIAGNOSIS — O24410 Gestational diabetes mellitus in pregnancy, diet controlled: Secondary | ICD-10-CM

## 2021-03-01 DIAGNOSIS — O0932 Supervision of pregnancy with insufficient antenatal care, second trimester: Secondary | ICD-10-CM

## 2021-03-02 DIAGNOSIS — O24410 Gestational diabetes mellitus in pregnancy, diet controlled: Secondary | ICD-10-CM | POA: Insufficient documentation

## 2021-03-09 ENCOUNTER — Encounter: Payer: Self-pay | Admitting: Dietician

## 2021-03-12 DIAGNOSIS — R7989 Other specified abnormal findings of blood chemistry: Secondary | ICD-10-CM

## 2021-03-12 DIAGNOSIS — Z23 Encounter for immunization: Secondary | ICD-10-CM

## 2021-03-12 DIAGNOSIS — O24112 Pre-existing diabetes mellitus, type 2, in pregnancy, second trimester: Secondary | ICD-10-CM | POA: Diagnosis not present

## 2021-03-12 DIAGNOSIS — Z789 Other specified health status: Secondary | ICD-10-CM | POA: Diagnosis not present

## 2021-03-12 DIAGNOSIS — O0992 Supervision of high risk pregnancy, unspecified, second trimester: Secondary | ICD-10-CM

## 2021-03-12 DIAGNOSIS — Z3A27 27 weeks gestation of pregnancy: Secondary | ICD-10-CM | POA: Diagnosis not present

## 2021-03-13 ENCOUNTER — Encounter: Payer: Self-pay | Admitting: Obstetrics and Gynecology

## 2021-03-22 DIAGNOSIS — O24112 Pre-existing diabetes mellitus, type 2, in pregnancy, second trimester: Secondary | ICD-10-CM

## 2021-03-22 DIAGNOSIS — O0992 Supervision of high risk pregnancy, unspecified, second trimester: Secondary | ICD-10-CM

## 2021-04-02 ENCOUNTER — Encounter: Payer: Self-pay | Admitting: *Deleted

## 2021-04-02 DIAGNOSIS — O24410 Gestational diabetes mellitus in pregnancy, diet controlled: Secondary | ICD-10-CM

## 2021-04-02 DIAGNOSIS — O24113 Pre-existing diabetes mellitus, type 2, in pregnancy, third trimester: Secondary | ICD-10-CM

## 2021-04-02 DIAGNOSIS — Z362 Encounter for other antenatal screening follow-up: Secondary | ICD-10-CM | POA: Diagnosis not present

## 2021-04-02 DIAGNOSIS — O09293 Supervision of pregnancy with other poor reproductive or obstetric history, third trimester: Secondary | ICD-10-CM | POA: Diagnosis not present

## 2021-04-02 DIAGNOSIS — O0933 Supervision of pregnancy with insufficient antenatal care, third trimester: Secondary | ICD-10-CM | POA: Diagnosis not present

## 2021-04-02 DIAGNOSIS — Z3A30 30 weeks gestation of pregnancy: Secondary | ICD-10-CM

## 2021-04-02 DIAGNOSIS — O24419 Gestational diabetes mellitus in pregnancy, unspecified control: Secondary | ICD-10-CM

## 2021-04-02 DIAGNOSIS — O0992 Supervision of high risk pregnancy, unspecified, second trimester: Secondary | ICD-10-CM | POA: Insufficient documentation

## 2021-04-02 DIAGNOSIS — O321XX0 Maternal care for breech presentation, not applicable or unspecified: Secondary | ICD-10-CM

## 2021-04-03 ENCOUNTER — Encounter: Payer: Self-pay | Admitting: Family Medicine

## 2021-04-03 DIAGNOSIS — O24419 Gestational diabetes mellitus in pregnancy, unspecified control: Secondary | ICD-10-CM

## 2021-04-05 DIAGNOSIS — O0992 Supervision of high risk pregnancy, unspecified, second trimester: Secondary | ICD-10-CM

## 2021-04-05 DIAGNOSIS — Z789 Other specified health status: Secondary | ICD-10-CM

## 2021-04-05 DIAGNOSIS — Z3A30 30 weeks gestation of pregnancy: Secondary | ICD-10-CM

## 2021-04-05 DIAGNOSIS — O24112 Pre-existing diabetes mellitus, type 2, in pregnancy, second trimester: Secondary | ICD-10-CM

## 2021-04-12 ENCOUNTER — Encounter: Payer: Self-pay | Admitting: Obstetrics and Gynecology

## 2021-04-12 DIAGNOSIS — O09893 Supervision of other high risk pregnancies, third trimester: Secondary | ICD-10-CM

## 2021-04-12 DIAGNOSIS — Z91199 Patient's noncompliance with other medical treatment and regimen due to unspecified reason: Secondary | ICD-10-CM | POA: Insufficient documentation

## 2021-04-12 DIAGNOSIS — O35BXX0 Maternal care for other (suspected) fetal abnormality and damage, fetal cardiac anomalies, not applicable or unspecified: Secondary | ICD-10-CM | POA: Insufficient documentation

## 2021-04-12 DIAGNOSIS — Z9119 Patient's noncompliance with other medical treatment and regimen: Secondary | ICD-10-CM

## 2021-04-12 DIAGNOSIS — Z5941 Food insecurity: Secondary | ICD-10-CM

## 2021-04-12 DIAGNOSIS — O358XX0 Maternal care for other (suspected) fetal abnormality and damage, not applicable or unspecified: Secondary | ICD-10-CM

## 2021-04-12 DIAGNOSIS — Z789 Other specified health status: Secondary | ICD-10-CM

## 2021-04-12 DIAGNOSIS — O403XX0 Polyhydramnios, third trimester, not applicable or unspecified: Secondary | ICD-10-CM | POA: Insufficient documentation

## 2021-04-12 DIAGNOSIS — O0992 Supervision of high risk pregnancy, unspecified, second trimester: Secondary | ICD-10-CM

## 2021-04-12 DIAGNOSIS — O24112 Pre-existing diabetes mellitus, type 2, in pregnancy, second trimester: Secondary | ICD-10-CM

## 2021-04-17 DIAGNOSIS — O0992 Supervision of high risk pregnancy, unspecified, second trimester: Secondary | ICD-10-CM

## 2021-04-17 DIAGNOSIS — O24112 Pre-existing diabetes mellitus, type 2, in pregnancy, second trimester: Secondary | ICD-10-CM

## 2021-04-17 DIAGNOSIS — Z3A32 32 weeks gestation of pregnancy: Secondary | ICD-10-CM | POA: Diagnosis not present

## 2021-04-19 ENCOUNTER — Encounter: Payer: Self-pay | Admitting: Family Medicine

## 2021-04-19 DIAGNOSIS — O24919 Unspecified diabetes mellitus in pregnancy, unspecified trimester: Secondary | ICD-10-CM

## 2021-04-19 DIAGNOSIS — O24419 Gestational diabetes mellitus in pregnancy, unspecified control: Secondary | ICD-10-CM | POA: Diagnosis not present

## 2021-04-26 ENCOUNTER — Encounter: Payer: Self-pay | Admitting: Family Medicine

## 2021-04-26 DIAGNOSIS — O0992 Supervision of high risk pregnancy, unspecified, second trimester: Secondary | ICD-10-CM

## 2021-04-26 DIAGNOSIS — O24113 Pre-existing diabetes mellitus, type 2, in pregnancy, third trimester: Secondary | ICD-10-CM

## 2021-04-26 DIAGNOSIS — Z789 Other specified health status: Secondary | ICD-10-CM

## 2021-04-26 DIAGNOSIS — Z3A33 33 weeks gestation of pregnancy: Secondary | ICD-10-CM

## 2021-04-26 DIAGNOSIS — E038 Other specified hypothyroidism: Secondary | ICD-10-CM

## 2021-04-26 DIAGNOSIS — O35BXX0 Maternal care for other (suspected) fetal abnormality and damage, fetal cardiac anomalies, not applicable or unspecified: Secondary | ICD-10-CM

## 2021-04-26 DIAGNOSIS — Z8759 Personal history of other complications of pregnancy, childbirth and the puerperium: Secondary | ICD-10-CM

## 2021-04-26 DIAGNOSIS — O24112 Pre-existing diabetes mellitus, type 2, in pregnancy, second trimester: Secondary | ICD-10-CM

## 2021-04-26 DIAGNOSIS — O403XX0 Polyhydramnios, third trimester, not applicable or unspecified: Secondary | ICD-10-CM

## 2021-04-26 DIAGNOSIS — O358XX0 Maternal care for other (suspected) fetal abnormality and damage, not applicable or unspecified: Secondary | ICD-10-CM

## 2021-05-02 ENCOUNTER — Encounter: Payer: Self-pay | Admitting: *Deleted

## 2021-05-02 DIAGNOSIS — O0933 Supervision of pregnancy with insufficient antenatal care, third trimester: Secondary | ICD-10-CM

## 2021-05-02 DIAGNOSIS — O24113 Pre-existing diabetes mellitus, type 2, in pregnancy, third trimester: Secondary | ICD-10-CM

## 2021-05-02 DIAGNOSIS — O0992 Supervision of high risk pregnancy, unspecified, second trimester: Secondary | ICD-10-CM | POA: Diagnosis present

## 2021-05-02 DIAGNOSIS — O09293 Supervision of pregnancy with other poor reproductive or obstetric history, third trimester: Secondary | ICD-10-CM | POA: Diagnosis not present

## 2021-05-02 DIAGNOSIS — Z3A34 34 weeks gestation of pregnancy: Secondary | ICD-10-CM

## 2021-05-02 DIAGNOSIS — O24119 Pre-existing diabetes mellitus, type 2, in pregnancy, unspecified trimester: Secondary | ICD-10-CM

## 2021-05-04 DIAGNOSIS — O35BXX0 Maternal care for other (suspected) fetal abnormality and damage, fetal cardiac anomalies, not applicable or unspecified: Secondary | ICD-10-CM

## 2021-05-04 DIAGNOSIS — O403XX0 Polyhydramnios, third trimester, not applicable or unspecified: Secondary | ICD-10-CM

## 2021-05-04 DIAGNOSIS — Z8759 Personal history of other complications of pregnancy, childbirth and the puerperium: Secondary | ICD-10-CM

## 2021-05-04 DIAGNOSIS — O358XX0 Maternal care for other (suspected) fetal abnormality and damage, not applicable or unspecified: Secondary | ICD-10-CM

## 2021-05-04 DIAGNOSIS — O24112 Pre-existing diabetes mellitus, type 2, in pregnancy, second trimester: Secondary | ICD-10-CM

## 2021-05-04 DIAGNOSIS — O0992 Supervision of high risk pregnancy, unspecified, second trimester: Secondary | ICD-10-CM

## 2021-05-09 ENCOUNTER — Encounter: Payer: Self-pay | Admitting: Family Medicine

## 2021-05-09 DIAGNOSIS — O24112 Pre-existing diabetes mellitus, type 2, in pregnancy, second trimester: Secondary | ICD-10-CM

## 2021-05-09 DIAGNOSIS — O0992 Supervision of high risk pregnancy, unspecified, second trimester: Secondary | ICD-10-CM | POA: Insufficient documentation

## 2021-05-09 DIAGNOSIS — O24119 Pre-existing diabetes mellitus, type 2, in pregnancy, unspecified trimester: Secondary | ICD-10-CM

## 2021-05-09 DIAGNOSIS — Z789 Other specified health status: Secondary | ICD-10-CM

## 2021-05-09 DIAGNOSIS — O35BXX0 Maternal care for other (suspected) fetal abnormality and damage, fetal cardiac anomalies, not applicable or unspecified: Secondary | ICD-10-CM

## 2021-05-09 DIAGNOSIS — O358XX0 Maternal care for other (suspected) fetal abnormality and damage, not applicable or unspecified: Secondary | ICD-10-CM

## 2021-05-09 DIAGNOSIS — E038 Other specified hypothyroidism: Secondary | ICD-10-CM

## 2021-05-09 DIAGNOSIS — Z8759 Personal history of other complications of pregnancy, childbirth and the puerperium: Secondary | ICD-10-CM

## 2021-05-09 DIAGNOSIS — O403XX0 Polyhydramnios, third trimester, not applicable or unspecified: Secondary | ICD-10-CM

## 2021-05-16 DIAGNOSIS — Z01812 Encounter for preprocedural laboratory examination: Secondary | ICD-10-CM | POA: Diagnosis present

## 2021-05-16 DIAGNOSIS — O24112 Pre-existing diabetes mellitus, type 2, in pregnancy, second trimester: Secondary | ICD-10-CM

## 2021-05-16 DIAGNOSIS — Z20822 Contact with and (suspected) exposure to covid-19: Secondary | ICD-10-CM | POA: Diagnosis not present

## 2021-05-16 DIAGNOSIS — Z3A36 36 weeks gestation of pregnancy: Secondary | ICD-10-CM

## 2021-05-18 ENCOUNTER — Encounter (HOSPITAL_COMMUNITY): Payer: Self-pay | Admitting: Family Medicine

## 2021-05-18 ENCOUNTER — Encounter (HOSPITAL_COMMUNITY): Payer: Self-pay | Admitting: Advanced Practice Midwife

## 2021-05-18 DIAGNOSIS — E1165 Type 2 diabetes mellitus with hyperglycemia: Secondary | ICD-10-CM | POA: Diagnosis present

## 2021-05-18 DIAGNOSIS — O358XX0 Maternal care for other (suspected) fetal abnormality and damage, not applicable or unspecified: Secondary | ICD-10-CM | POA: Diagnosis present

## 2021-05-18 DIAGNOSIS — O2412 Pre-existing diabetes mellitus, type 2, in childbirth: Secondary | ICD-10-CM | POA: Diagnosis present

## 2021-05-18 DIAGNOSIS — O0932 Supervision of pregnancy with insufficient antenatal care, second trimester: Secondary | ICD-10-CM

## 2021-05-18 DIAGNOSIS — O24419 Gestational diabetes mellitus in pregnancy, unspecified control: Secondary | ICD-10-CM | POA: Diagnosis present

## 2021-05-18 DIAGNOSIS — Z8759 Personal history of other complications of pregnancy, childbirth and the puerperium: Secondary | ICD-10-CM

## 2021-05-18 DIAGNOSIS — O134 Gestational [pregnancy-induced] hypertension without significant proteinuria, complicating childbirth: Secondary | ICD-10-CM | POA: Diagnosis present

## 2021-05-18 DIAGNOSIS — Z3A37 37 weeks gestation of pregnancy: Secondary | ICD-10-CM | POA: Diagnosis not present

## 2021-05-18 DIAGNOSIS — Z7984 Long term (current) use of oral hypoglycemic drugs: Secondary | ICD-10-CM

## 2021-05-18 DIAGNOSIS — Z789 Other specified health status: Secondary | ICD-10-CM | POA: Diagnosis present

## 2021-05-18 DIAGNOSIS — O24112 Pre-existing diabetes mellitus, type 2, in pregnancy, second trimester: Secondary | ICD-10-CM | POA: Diagnosis present

## 2021-05-30 DIAGNOSIS — Z0130 Encounter for examination of blood pressure without abnormal findings: Secondary | ICD-10-CM

## 2021-07-08 ENCOUNTER — Encounter (HOSPITAL_COMMUNITY): Payer: Self-pay | Admitting: Emergency Medicine

## 2021-07-08 DIAGNOSIS — J069 Acute upper respiratory infection, unspecified: Secondary | ICD-10-CM

## 2021-07-08 DIAGNOSIS — U071 COVID-19: Secondary | ICD-10-CM

## 2021-07-08 DIAGNOSIS — Z20822 Contact with and (suspected) exposure to covid-19: Secondary | ICD-10-CM | POA: Diagnosis not present

## 2021-07-08 DIAGNOSIS — K1379 Other lesions of oral mucosa: Secondary | ICD-10-CM

## 2021-07-17 DIAGNOSIS — E119 Type 2 diabetes mellitus without complications: Secondary | ICD-10-CM

## 2021-07-17 DIAGNOSIS — O24112 Pre-existing diabetes mellitus, type 2, in pregnancy, second trimester: Secondary | ICD-10-CM

## 2021-10-05 ENCOUNTER — Emergency Department (HOSPITAL_BASED_OUTPATIENT_CLINIC_OR_DEPARTMENT_OTHER): Payer: BC Managed Care – PPO

## 2021-10-05 ENCOUNTER — Other Ambulatory Visit: Payer: Self-pay

## 2021-10-05 ENCOUNTER — Emergency Department (HOSPITAL_BASED_OUTPATIENT_CLINIC_OR_DEPARTMENT_OTHER)
Admission: EM | Admit: 2021-10-05 | Discharge: 2021-10-05 | Disposition: A | Discharge status: Home / Self Care | Payer: BC Managed Care – PPO | Attending: Emergency Medicine | Admitting: Emergency Medicine

## 2021-10-05 DIAGNOSIS — R1013 Epigastric pain: Secondary | ICD-10-CM | POA: Diagnosis not present

## 2021-10-05 DIAGNOSIS — I10 Essential (primary) hypertension: Secondary | ICD-10-CM | POA: Diagnosis not present

## 2021-10-05 DIAGNOSIS — M25562 Pain in left knee: Secondary | ICD-10-CM | POA: Insufficient documentation

## 2021-10-05 DIAGNOSIS — R0781 Pleurodynia: Secondary | ICD-10-CM | POA: Diagnosis not present

## 2021-10-05 DIAGNOSIS — R519 Headache, unspecified: Secondary | ICD-10-CM | POA: Diagnosis not present

## 2021-10-05 DIAGNOSIS — E119 Type 2 diabetes mellitus without complications: Secondary | ICD-10-CM | POA: Insufficient documentation

## 2021-10-05 DIAGNOSIS — M79621 Pain in right upper arm: Secondary | ICD-10-CM | POA: Insufficient documentation

## 2021-10-05 DIAGNOSIS — Z7984 Long term (current) use of oral hypoglycemic drugs: Secondary | ICD-10-CM | POA: Diagnosis not present

## 2021-10-05 LAB — COMPREHENSIVE METABOLIC PANEL
ALT: 24 U/L (ref 0–44)
AST: 33 U/L (ref 15–41)
Albumin: 4.3 g/dL (ref 3.5–5.0)
Alkaline Phosphatase: 65 U/L (ref 38–126)
Anion gap: 6 (ref 5–15)
BUN: 9 mg/dL (ref 6–20)
CO2: 27 mmol/L (ref 22–32)
Calcium: 9.8 mg/dL (ref 8.9–10.3)
Chloride: 105 mmol/L (ref 98–111)
Creatinine, Ser: 0.53 mg/dL (ref 0.44–1.00)
GFR, Estimated: >60 mL/min (ref >60)
Glucose, Bld: 91 mg/dL (ref 70–99)
Potassium: 3.8 mmol/L (ref 3.5–5.1)
Sodium: 138 mmol/L (ref 135–145)
Total Bilirubin: 0.6 mg/dL (ref 0.3–1.2)
Total Protein: 8.1 g/dL (ref 6.5–8.1)

## 2021-10-05 LAB — CBC WITH DIFFERENTIAL/PLATELET
Abs Immature Granulocytes: 0.02 10*3/uL (ref 0.00–0.07)
Basophils Absolute: 0 10*3/uL (ref 0.0–0.1)
Basophils Relative: 0 %
Eosinophils Absolute: 0 10*3/uL (ref 0.0–0.5)
Eosinophils Relative: 0 %
HCT: 40.3 % (ref 36.0–46.0)
Hemoglobin: 13.4 g/dL (ref 12.0–15.0)
Immature Granulocytes: 0 %
Lymphocytes Relative: 22 %
Lymphs Abs: 1.5 10*3/uL (ref 0.7–4.0)
MCH: 30.3 pg (ref 26.0–34.0)
MCHC: 33.3 g/dL (ref 30.0–36.0)
MCV: 91.2 fL (ref 80.0–100.0)
Monocytes Absolute: 0.3 10*3/uL (ref 0.1–1.0)
Monocytes Relative: 4 %
Neutro Abs: 5 10*3/uL (ref 1.7–7.7)
Neutrophils Relative %: 74 %
Platelets: 317 10*3/uL (ref 150–400)
RBC: 4.42 MIL/uL (ref 3.87–5.11)
RDW: 13.1 % (ref 11.5–15.5)
WBC: 6.9 10*3/uL (ref 4.0–10.5)
nRBC: 0 % (ref 0.0–0.2)

## 2021-10-05 LAB — PREGNANCY, URINE: Preg Test, Ur: NEGATIVE

## 2021-10-05 MED ORDER — FENTANYL CITRATE PF 50 MCG/ML IJ SOSY
50.0000 ug | PREFILLED_SYRINGE | Freq: Once | INTRAMUSCULAR | Status: AC
  Administered 2021-10-05: 50 ug via INTRAVENOUS
  Filled 2021-10-05: qty 1

## 2021-10-05 MED ORDER — OXYCODONE-ACETAMINOPHEN 5-325 MG PO TABS: 1.0000 | ORAL_TABLET | Freq: Four times a day (QID) | ORAL | 0 refills | Status: AC | PRN

## 2021-10-05 MED ORDER — IOHEXOL 300 MG/ML  SOLN
100.0000 mL | Freq: Once | INTRAMUSCULAR | Status: AC | PRN
  Administered 2021-10-05: 100 mL via INTRAVENOUS
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
## 2021-10-05 NOTE — ED Provider Notes (Signed)
[facility]-EMERGENCY DEPT Provider Note   CSN: [id number] Arrival date & time: 11/03/22  1044     History  Chief Complaint  Patient presents with   Shortness of Breath    Michele Johnston is a 25 y.o. female with a past medical history of seizure, throat cancer, skin cancer brought into the emergency department by [facility] for evaluation of shortness of breath.  Patient states that he has had worsening shortness of breath in the last 3 to 4 days.  Reports history of throat cancer that he uses suction at home to remove mucus in his throat.  States in the last few days he has not been able to suction the mucus which increases shortness of breath.  States he is undergoing immunotherapy for throat cancer once every 3 weeks, last treatment was on Wednesday.  Denies fever, chest pain, headache, dizziness, bowel changes, urinary symptoms.   Shortness of Breath     Past Medical History:  Diagnosis Date   Seizures (HCC)    Past Surgical History:  Procedure Laterality Date   APPENDECTOMY     RESECTION DISTAL CLAVICAL       Home Medications Prior to Admission medications   Medication Sig Start Date End Date Taking? Authorizing Provider  ACCU-CHEK AVIVA PLUS test strip USE TO CHECK BLOOD GLUCOSE BID 03/22/19   [provider]  Accu-Chek Softclix Lancets lancets USE TO CHECK BLOOD GLUCOSE BID AS DIRECTED 03/22/19   [provider]  Blood Glucose Monitoring Suppl (ACCU-CHEK AVIVA PLUS) w/Device KIT CHECK BLOOD GLUCOSE BID 03/22/19   [provider]  Cholecalciferol (D3-1000 PO) Take 2,000 mg by mouth daily.     [provider]  DEPAKOTE SPRINKLES 125 MG capsule Take 4 capsules in AM, 8 capsules at night 08/28/22   Aquino, Karen M, MD  levETIRAcetam (KEPPRA) 1000 MG tablet TAKE 1 TABLET BY MOUTH TWICE DAILY 12/26/21   Aquino, Karen M, MD  lovastatin (MEVACOR) 10 MG tablet TK 1 T PO QD 01/01/18   [provider]  metFORMIN (GLUCOPHAGE)  500 MG tablet Take 500 mg by mouth 2 (two) times daily. 10/02/21   [provider]      Allergies    Patient has no known allergies.    Review of Systems   Review of Systems  Respiratory:  Positive for shortness of breath.     Physical Exam Updated Vital Signs BP 133/68   Pulse 86   Temp 97.7 F (36.5 C) (Oral)   Resp 16   Ht 5' 9" (1.753 m)   Wt 56.7 kg   SpO2 98%   BMI 18.46 kg/m  Physical Exam  ED Results / Procedures / Treatments   Labs (all labs ordered are listed, but only abnormal results are displayed) Labs Reviewed  RESP PANEL BY RT-PCR (RSV, FLU A&B, COVID)  [id number]  URINALYSIS, ROUTINE W REFLEX MICROSCOPIC  BASIC METABOLIC PANEL  CBC WITH DIFFERENTIAL/PLATELET    EKG EKG Interpretation  Date/Time:  Sunday November 03 2022 10:55:19 EST Ventricular Rate:  87 PR Interval:  150 QRS Duration: 86 QT Interval:  387 QTC Calculation: 466 R Axis:   -75 Text Interpretation: Sinus rhythm Left anterior fascicular block Anteroseptal infarct, old Confirmed by Countryman, Chase ([id number]) on 11/03/2022 2:45:08 PM  Radiology No results found.  Procedures Procedures    Medications Ordered in ED Medications - No data to display  ED Course/ Medical Decision Making/ A&P Clinical Course as of 11/03/22 1755  Sun Nov 03, 2022  1138 Stable 48 YOM with throat cancer here with SOB. Feels oropharyngeal closure.   [CC]  1444 Patient requested DC [CC]    Clinical Course User Index [CC] Countryman, Chase, MD                           Medical Decision Making Amount and/or Complexity of Data Reviewed Labs: ordered. Radiology: ordered.   This patient presents to the ED for shortness of breath, this involves an extensive number of treatment options, and is a complaint that carries with a high risk of complications and morbidity.  The differential diagnosis includes metastasis, flu, COVID, bronchitis, pneumonia, infectious etiology.  This is not an exhaustive  list.  Comorbidities that complicate the patient evaluation See HPI  Social determinants of health NA  Additional history obtained: External records from outside source obtained and reviewed including: Chart review including previous notes, labs, imaging.  Cardiac monitoring/EKG: The patient was maintained on a cardiac monitor.  I personally reviewed and interpreted the cardiac monitor which showed an underlying rhythm of: Sinus rhythm.  Lab tests: I ordered CBC, BMP, urinalysis, respiratory panel.  However due to technical issue with the chart, labs cannot be drawn.  Imaging studies: I ordered x-ray of the chest.  However due to technical issues with the chart, imaging cannot be done.  Problem list/ ED course/ Critical interventions/ Medical management: HPI: See above Vital signs within normal range and stable throughout visit. Laboratory/imaging studies significant for: See above. On physical examination, patient is afebrile and appears in no acute distress.  Patient is here due to increased shortness of breath in the last 3-4 days when he failed to suction the mucus from his throat cancer that he is usually able to.  Due to technical issue with the chart, labs and imaging cannot be performed by nursing.  Patient was placed on 2 L nasal cannula with EMS, satting at 99%.  Patient is weaned off of the oxygen in the ER and he seemed to be doing well, satting at 98% on room air.  Patient and patient family requested to go home since his shortness of breath has improved significantly. They refused to wait for labs and imaging studies to be done when technical issues with charting are fixed. Advised patient to follow-up with primary care for further evaluation and management.  Return to ER if new or worsening symptoms. I have reviewed the patient home medicines and have made adjustments as needed.  Consultations obtained: I requested consultation with Dr. Countryman, and discussed lab and  imaging findings as well as pertinent plan.  He/she agrees with the plan.  Disposition Continued outpatient therapy. Follow-up with PCP  recommended for reevaluation of symptoms. Treatment plan discussed with patient.  Pt acknowledged understanding was agreeable to the plan. Worrisome signs and symptoms were discussed with patient, and patient acknowledged understanding to return to the ED if they noticed these signs and symptoms. Patient was stable upon discharge.   This chart was dictated using voice recognition software.  Despite best efforts to proofread,  errors can occur which can change the documentation meaning.          Final Clinical Impression(s) / ED Diagnoses Final diagnoses:  Shortness of breath    Rx / DC Orders ED Discharge Orders     None         Le, Ken, PA 11/03/22 1755    Le, Ken, PA 11/05/22 1619      Countryman, Chase, MD 11/06/22 1541  
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
## 2021-10-05 NOTE — ED Notes (Signed)
Pt transported to xray 

## 2021-10-05 NOTE — ED Notes (Signed)
D/c paperwork reviewed with pt, including prescription. Pt with no questions, reports that she has no phone and no way of getting home. RN to facilitate safe transport home for pt.

## 2021-10-16 ENCOUNTER — Ambulatory Visit: Payer: BC Managed Care – PPO | Admitting: Critical Care Medicine
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
## 2021-10-17 ENCOUNTER — Encounter (HOSPITAL_COMMUNITY): Payer: Self-pay | Admitting: Emergency Medicine

## 2021-10-17 ENCOUNTER — Other Ambulatory Visit: Payer: Self-pay

## 2021-10-17 ENCOUNTER — Encounter: Payer: Self-pay | Admitting: Physician Assistant

## 2021-10-17 ENCOUNTER — Ambulatory Visit (INDEPENDENT_AMBULATORY_CARE_PROVIDER_SITE_OTHER): Payer: BC Managed Care – PPO | Admitting: Physician Assistant

## 2021-10-17 VITALS — BP 114/77 | HR 66 | Temp 97.9°F | Resp 16 | Ht 60.0 in | Wt 147.8 lb

## 2021-10-17 DIAGNOSIS — R7303 Prediabetes: Secondary | ICD-10-CM | POA: Diagnosis not present

## 2021-10-17 DIAGNOSIS — S60222A Contusion of left hand, initial encounter: Secondary | ICD-10-CM | POA: Diagnosis not present

## 2021-10-17 DIAGNOSIS — Z09 Encounter for follow-up examination after completed treatment for conditions other than malignant neoplasm: Secondary | ICD-10-CM

## 2021-10-17 DIAGNOSIS — S20211S Contusion of right front wall of thorax, sequela: Secondary | ICD-10-CM

## 2021-10-17 LAB — GLUCOSE, POCT (MANUAL RESULT ENTRY): POC Glucose: 93 mg/dl (ref 70–99)

## 2021-10-17 MED ORDER — DICLOFENAC SODIUM 75 MG PO TBEC: 75.0000 mg | DELAYED_RELEASE_TABLET | Freq: Two times a day (BID) | ORAL | 0 refills | Status: AC

## 2021-10-17 MED ORDER — METHOCARBAMOL 500 MG PO TABS: 1000.0000 mg | ORAL_TABLET | Freq: Three times a day (TID) | ORAL | 0 refills | Status: AC
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
## 2021-10-22 ENCOUNTER — Encounter (HOSPITAL_COMMUNITY): Payer: Self-pay | Admitting: Emergency Medicine

## 2021-11-15 ENCOUNTER — Other Ambulatory Visit: Payer: Self-pay | Admitting: Physician Assistant

## 2021-11-15 DIAGNOSIS — S20211S Contusion of right front wall of thorax, sequela: Secondary | ICD-10-CM

## 2021-11-15 NOTE — Telephone Encounter (Signed)
Needs to go to Primary Care at [facility].

## 2021-11-21 ENCOUNTER — Ambulatory Visit: Payer: BC Managed Care – PPO | Admitting: Critical Care Medicine

## 2021-12-20 ENCOUNTER — Ambulatory Visit: Payer: BC Managed Care – PPO | Admitting: Family Medicine

## 2022-01-07 ENCOUNTER — Other Ambulatory Visit: Payer: Self-pay

## 2022-01-07 ENCOUNTER — Encounter: Payer: Self-pay | Admitting: Family Medicine

## 2022-01-07 ENCOUNTER — Ambulatory Visit (INDEPENDENT_AMBULATORY_CARE_PROVIDER_SITE_OTHER): Payer: BC Managed Care – PPO | Admitting: Family Medicine

## 2022-01-07 ENCOUNTER — Other Ambulatory Visit: Payer: Self-pay | Admitting: Family Medicine

## 2022-01-07 VITALS — BP 102/69 | HR 70 | Temp 98.1°F | Resp 16 | Wt 155.8 lb

## 2022-01-07 DIAGNOSIS — R7303 Prediabetes: Secondary | ICD-10-CM | POA: Diagnosis not present

## 2022-01-07 DIAGNOSIS — Z789 Other specified health status: Secondary | ICD-10-CM | POA: Diagnosis not present

## 2022-01-07 DIAGNOSIS — Z124 Encounter for screening for malignant neoplasm of cervix: Secondary | ICD-10-CM | POA: Diagnosis not present

## 2022-01-07 DIAGNOSIS — Z113 Encounter for screening for infections with a predominantly sexual mode of transmission: Secondary | ICD-10-CM

## 2022-01-07 LAB — POCT GLYCOSYLATED HEMOGLOBIN (HGB A1C): Hemoglobin A1C: 5.5 % (ref 4.0–5.6)
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
## 2022-01-07 NOTE — Progress Notes (Signed)
Established Patient Office Visit  Subjective:  Patient ID: Michele Johnston, female    DOB: 01/29/1996  Age: 26 y.o. MRN: [id number]  CC:  Chief Complaint  Patient presents with   Follow-up   Diabetes    HPI Michele Johnston presents for follow up of pre-diabetes. She also is requesting a pap smear but denies acute complaints.   No past medical history on file.  No past surgical history on file.  No family history on file.  Social History   Socioeconomic History   Marital status: Single    Spouse name: Not on file   Number of children: Not on file   Years of education: Not on file   Highest education level: Not on file  Occupational History   Not on file  Tobacco Use   Smoking status: Never   Smokeless tobacco: Never  Substance and Sexual Activity   Alcohol use: Not Currently   Drug use: Not Currently   Sexual activity: Not on file  Other Topics Concern   Not on file  Social History Narrative   Not on file   Social Determinants of Health   Financial Resource Strain: Not on file  Food Insecurity: Not on file  Transportation Needs: Not on file  Physical Activity: Not on file  Stress: Not on file  Social Connections: Not on file  Intimate Partner Violence: Not on file    ROS Review of Systems  Genitourinary:  Negative for vaginal discharge.  All other systems reviewed and are negative.  Objective:   Today's Vitals: BP 102/69    Pulse 70    Temp 98.1 F (36.7 C) (Oral)    Resp 16    Wt 155 lb 12.8 oz (70.7 kg)    BMI 30.43 kg/m   Physical Exam Vitals and nursing note reviewed.  Constitutional:      General: She is not in acute distress. Cardiovascular:     Rate and Rhythm: Normal rate and regular rhythm.  Pulmonary:     Effort: Pulmonary effort is normal.     Breath sounds: Normal breath sounds.  Abdominal:     Palpations: Abdomen is soft.     Tenderness: There is no abdominal tenderness.     Hernia: There is no hernia in the left inguinal  area or right inguinal area.  Genitourinary:    Exam position: Supine.     Labia:        Right: No lesion.        Left: No lesion.      Vagina: Normal.     Cervix: Normal.     Uterus: Normal.      Adnexa: Right adnexa normal and left adnexa normal.  Neurological:     General: No focal deficit present.     Mental Status: She is alert and oriented to person, place, and time.    Assessment & Plan:   1. Prediabetes A1c is wnl. Reassurance given. Continue to monitor - HgB A1c  2. Pap smear for cervical cancer screening  - Cytology - PAP  3. Screening examination for STD (sexually transmitted disease)  - Cervicovaginal ancillary only  4. Language barrier to communication    Outpatient Encounter Medications as of 01/07/2022  Medication Sig   diclofenac (VOLTAREN) 75 MG EC tablet Take 1 tablet (75 mg total) by mouth 2 (two) times daily. X 5 days then prn pain (Patient not taking: Reported on 01/07/2022)   methocarbamol (ROBAXIN) 500 MG tablet Take 2 tablets (  1,000 mg total) by mouth 3 (three) times daily. X 5 days then prn pain/muscle spasm (Patient not taking: Reported on 01/07/2022)   No facility-administered encounter medications on file as of 01/07/2022.    Follow-up: No follow-ups on file.   Tommie Raymond, MD

## 2022-01-08 ENCOUNTER — Encounter: Payer: Self-pay | Admitting: Family Medicine

## 2022-01-10 LAB — IGP, RFX APTIMA HPV ASCU: PAP Smear Comment: 0

## 2022-01-11 ENCOUNTER — Encounter: Payer: Self-pay | Admitting: Family Medicine

## 2022-01-13 LAB — NUSWAB VAGINITIS PLUS (VG+)
Candida albicans, NAA: NEGATIVE
Candida glabrata, NAA: NEGATIVE
Chlamydia trachomatis, NAA: NEGATIVE
Neisseria gonorrhoeae, NAA: NEGATIVE
Trich vag by NAA: NEGATIVE

## 2022-01-13 LAB — SPECIMEN STATUS REPORT

## 2022-05-20 ENCOUNTER — Emergency Department (HOSPITAL_COMMUNITY)
Admission: EM | Admit: 2022-05-20 | Discharge: 2022-05-20 | Disposition: A | Discharge status: Home / Self Care | Payer: BC Managed Care – PPO | Attending: Emergency Medicine | Admitting: Emergency Medicine

## 2022-05-20 ENCOUNTER — Encounter (HOSPITAL_COMMUNITY): Payer: Self-pay | Admitting: Emergency Medicine

## 2022-05-20 DIAGNOSIS — Z3002 Counseling and instruction in natural family planning to avoid pregnancy: Secondary | ICD-10-CM | POA: Diagnosis present

## 2022-05-20 DIAGNOSIS — Z3009 Encounter for other general counseling and advice on contraception: Secondary | ICD-10-CM

## 2022-05-20 NOTE — ED Triage Notes (Signed)
Patient requesting IUD removal placed in April.

## 2022-05-31 IMAGING — US US MFM OB FOLLOW-UP
1 series · 13 of 28 positions shown · non-contrast
Comparison: none

[Series 1: us mfm ob follow-up · 13 of 96 slices shown]
[im 4/96]
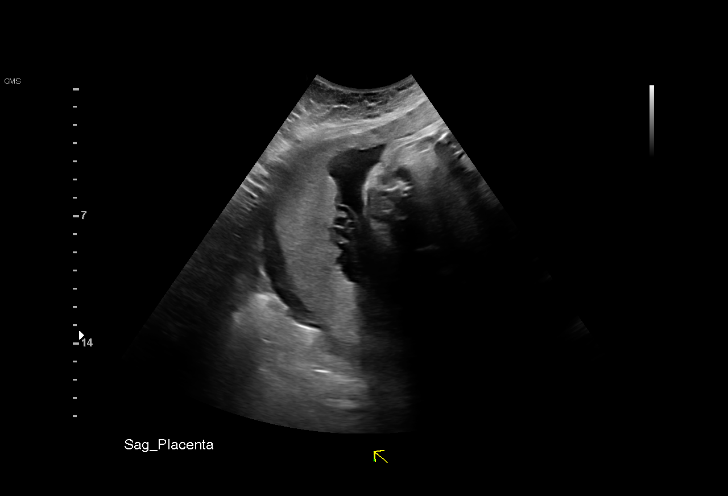
[im 11/96]
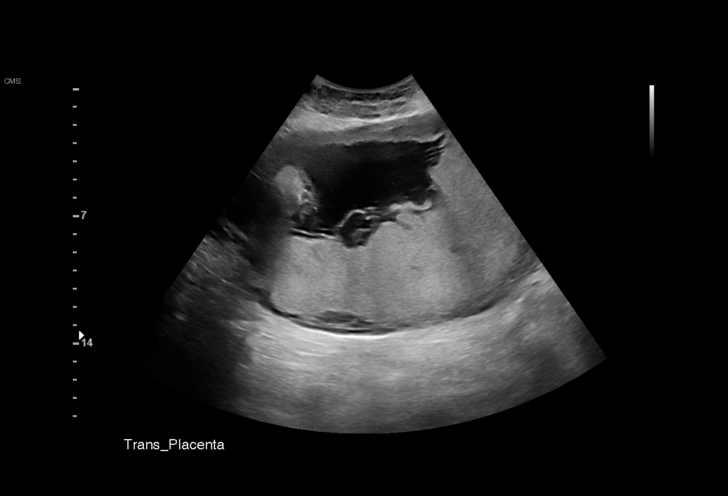
[im 18/96]
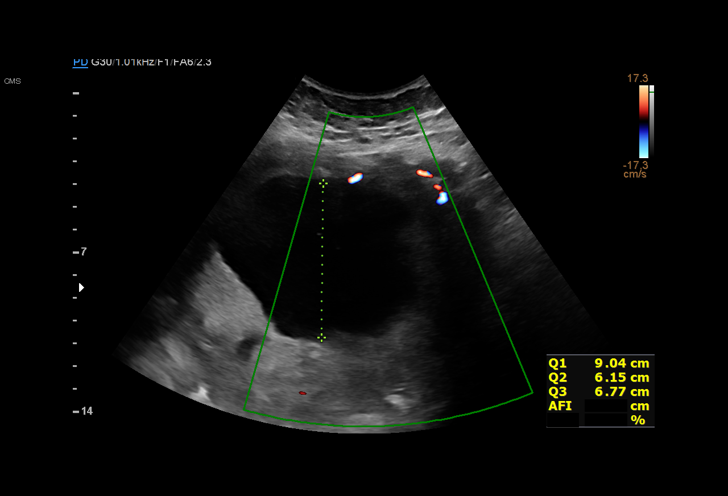
[im 25/96]
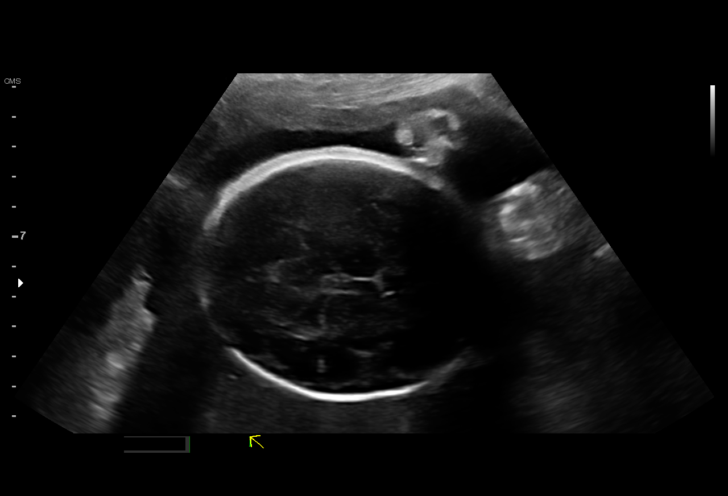
[im 32/96]
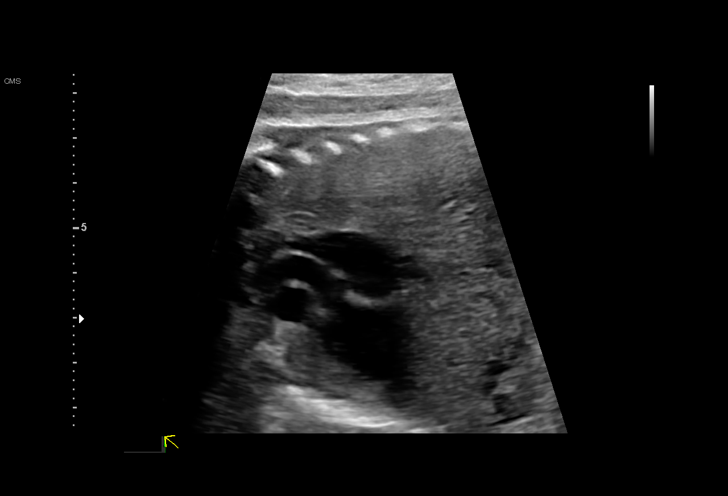
[im 39/96]
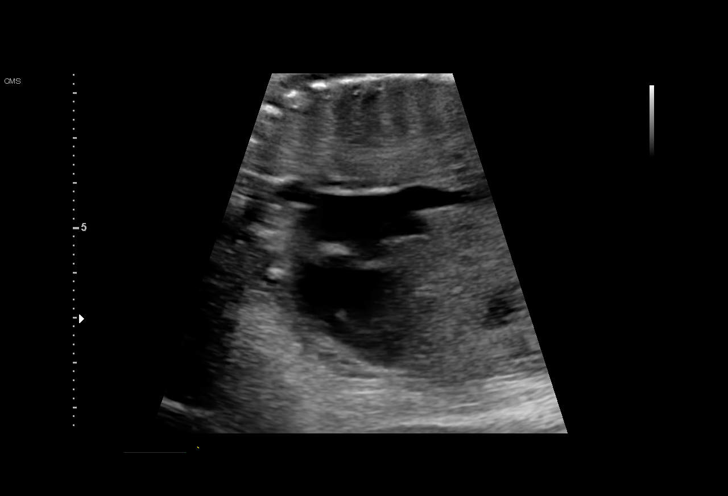
[im 50/96]
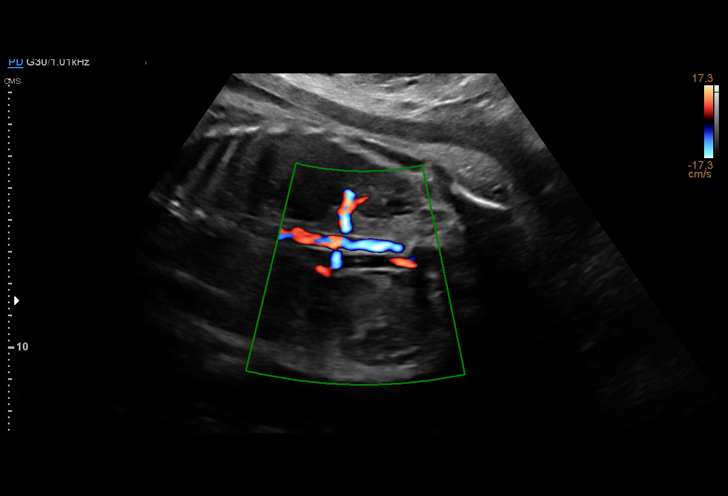
[im 57/96]
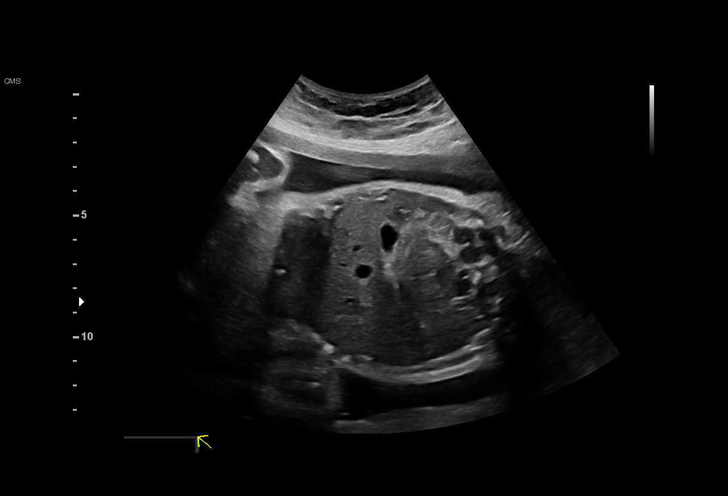
[im 64/96]
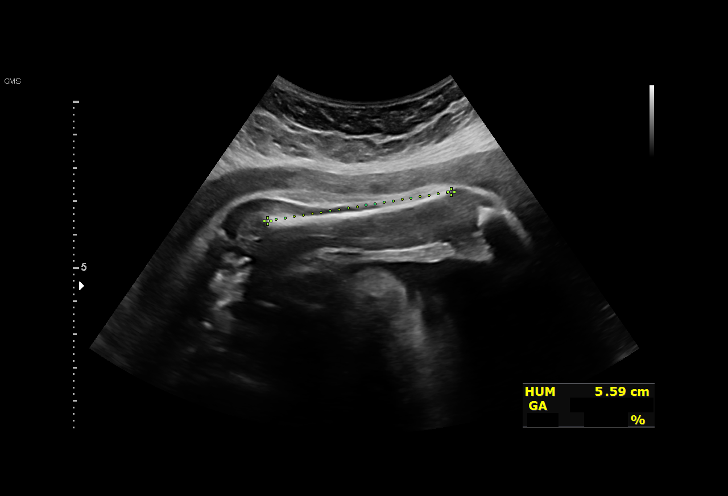
[im 71/96]
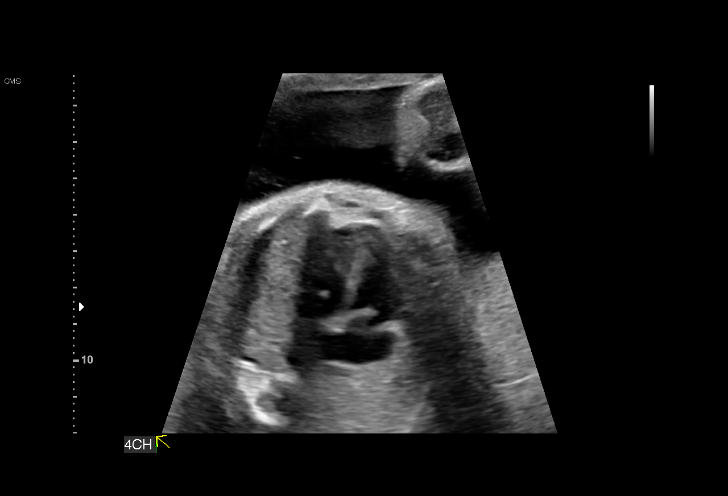
[im 78/96]
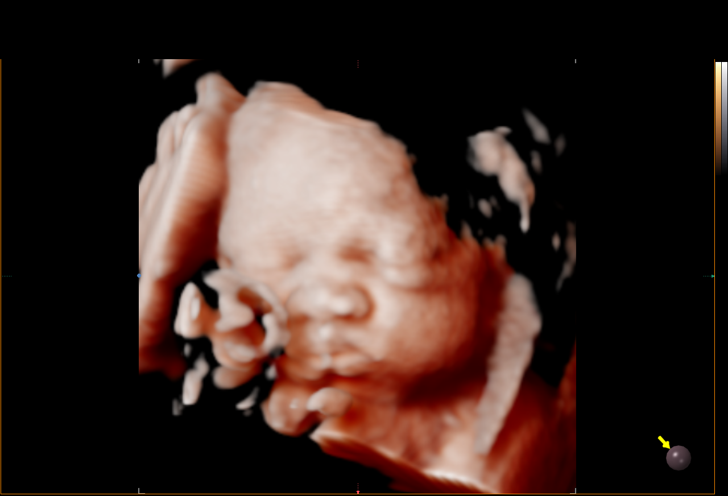
[im 85/96]
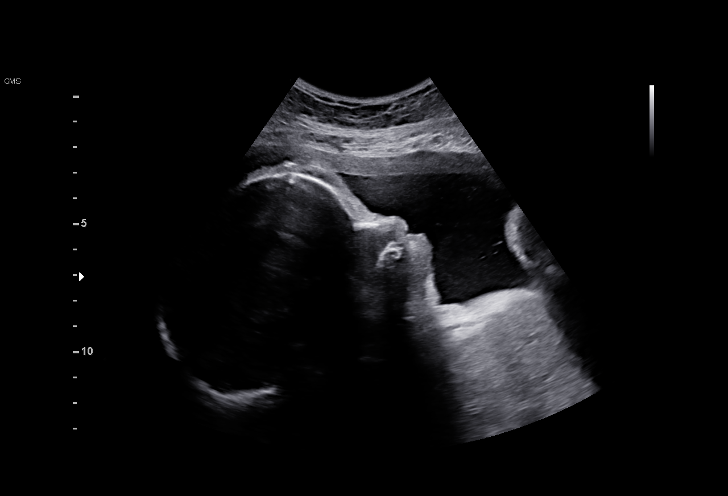
[im 92/96]
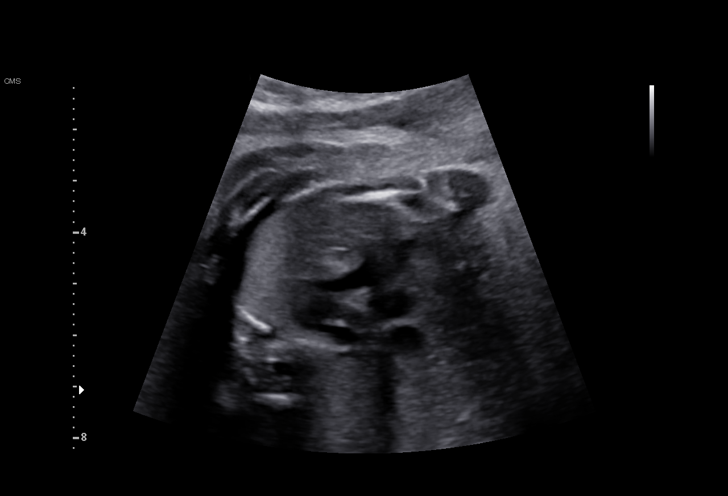

[13 of 28 positions shown; findings below may reference images not displayed]

Indications

 Pre-existing diabetes, type 2, in pregnancy,
 third trimester (no meds)
 30 weeks gestation of pregnancy
 Late to prenatal care, third trimester
 Poor obstetric history: Previous
 preeclampsia / eclampsia/gestational HTN
 Antenatal follow-up for nonvisualized fetal
 anatomy
Fetal Evaluation

 Num Of Fetuses:         1
 Fetal Heart Rate(bpm):  147
 Cardiac Activity:       Observed
 Presentation:           Breech
 Placenta:               Posterior
 P. Cord Insertion:      Visualized

 AFI Sum(cm)     %Tile       Largest Pocket(cm)
 25.7            97

 RUQ(cm)       RLQ(cm)       LUQ(cm)        LLQ(cm)

Biometry
 BPD:        80  mm     G. Age:  32w 1d         86  %    CI:        74.06   %    70 - 86
                                                         FL/HC:      20.7   %    19.2 -
 HC:      295.2  mm     G. Age:  32w 4d         76  %    HC/AC:      1.10        0.99 -
 AC:      268.7  mm     G. Age:  31w 0d         62  %    FL/BPD:     76.4   %    71 - 87
 FL:       61.1  mm     G. Age:  31w 5d         72  %    FL/AC:      22.7   %    20 - 24
 HUM:      56.1  mm     G. Age:  32w 5d         92  %
 CER:        38  mm     G. Age:  31w 1d         52  %

 LV:          3  mm

 Est. FW:    6882  gm    3 lb 15 oz      74  %
OB History

 Gravidity:    2         Term:   1
 Living:       1
Gestational Age

 LMP:           30w 3d        Date:  09/01/20                 EDD:   06/08/21
 U/S Today:     31w 6d                                        EDD:   05/29/21
 Best:          30w 3d     Det. By:  LMP  (09/01/20)          EDD:   06/08/21
Anatomy

 Cranium:               Appears normal         LVOT:                   Appears normal
 Cavum:                 Appears normal         Aortic Arch:            Appears normal
 Ventricles:            Appears normal         Ductal Arch:            Appears normal
 Choroid Plexus:        Appears normal         Diaphragm:              Appears normal
 Cerebellum:            Appears normal         Stomach:                Appears normal, left
                                                                       sided
 Posterior Fossa:       Appears normal         Abdomen:                Appears normal
 Nuchal Fold:           Not applicable (>20    Abdominal Wall:         Previously seen
                        wks GA)
 Face:                  Appears normal         Cord Vessels:           Previously seen
                        (orbits and profile)
 Lips:                  Appears normal         Kidneys:                Appear normal
 Palate:                Not well visualized    Bladder:                Appears normal
 Thoracic:              Appears normal         Spine:                  Previously seen
 Heart:                 Appears normal         Upper Extremities:      Previously seen
                        (4CH, axis, and
                        situs)
 RVOT:                  Appears normal         Lower Extremities:      Previously seen

 Other:  Female gender previously seen.Nasal bone visualized. Lenses
         previously visualized. Heels/feet and open hands visualized. VC, 3VV
         and 3VTV previously visualized.
Cervix Uterus Adnexa

 Cervix
 Length:           3.74  cm.
 Normal appearance by transabdominal scan.

 Uterus
 No abnormality visualized.
 Right Ovary
 Within normal limits.

 Left Ovary
 Within normal limits.

 Cul De Sac
 No free fluid seen.

 Adnexa
 No abnormality visualized.
Comments

 This patient was seen for a follow up growth scan due to due
 to pregestational diabetes and history of chronic
 hypertension.  She denies any problems since her last exam.
 She was informed that the fetal growth was appropriate for
 her gestational age.  Mild polyhydramnios with a total AFI of
 25.7 cm was noted.
 Due to pregestational diabetes, she already has a fetal
 echocardiogram scheduled with pediatric cardiology on May
 Due to polyhydramnios and her history of diabetes, we will
 start weekly fetal testing at around 32 weeks.
 A biophysical profile was scheduled in 2 weeks.
 All conversations were held with the patient with the
 [place] interpreter today.

## 2022-06-24 IMAGING — US US FETAL BPP W/ NON-STRESS
1 series · 14 of 14 positions shown · non-contrast
Comparison: none

[Series 1: us fetal bpp w/ non-stress · 14 acquisitions, 14 frames shown]
[im 1/14]
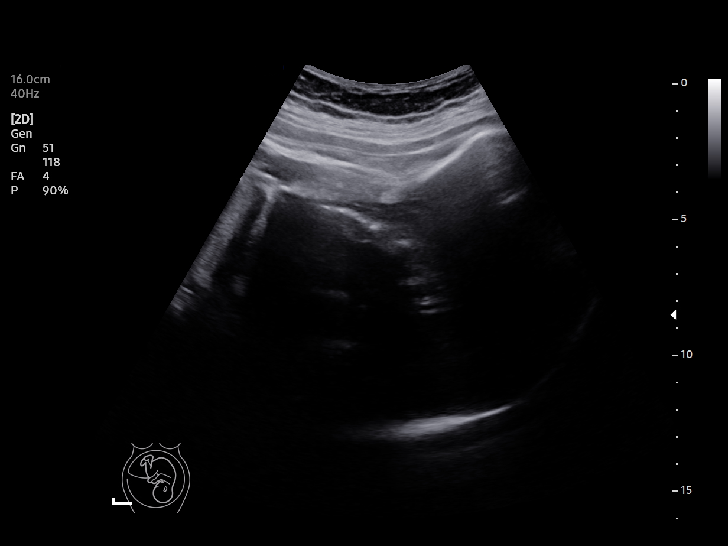
[im 2/14]
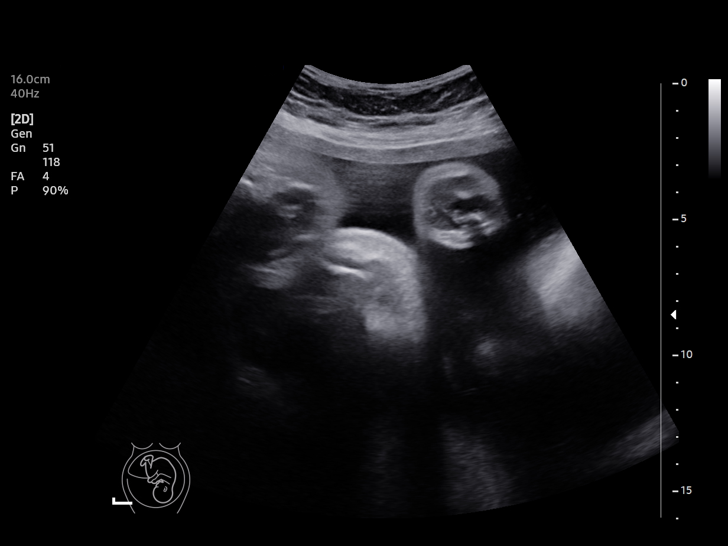
[im 3/14]
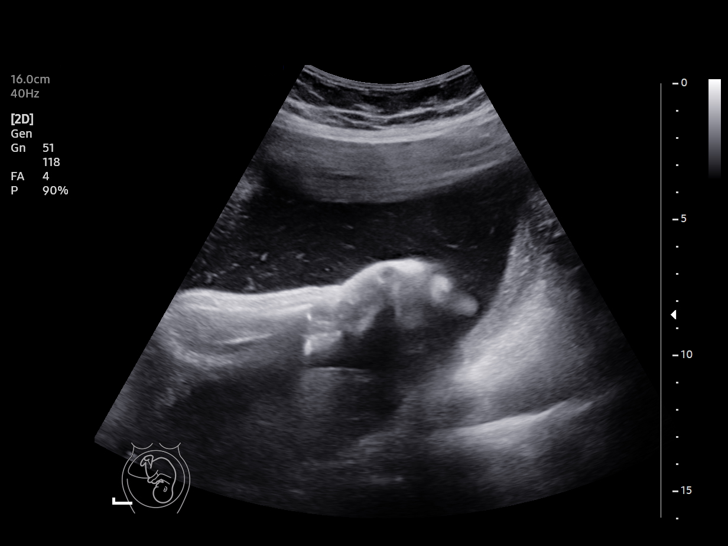
[im 4/14]
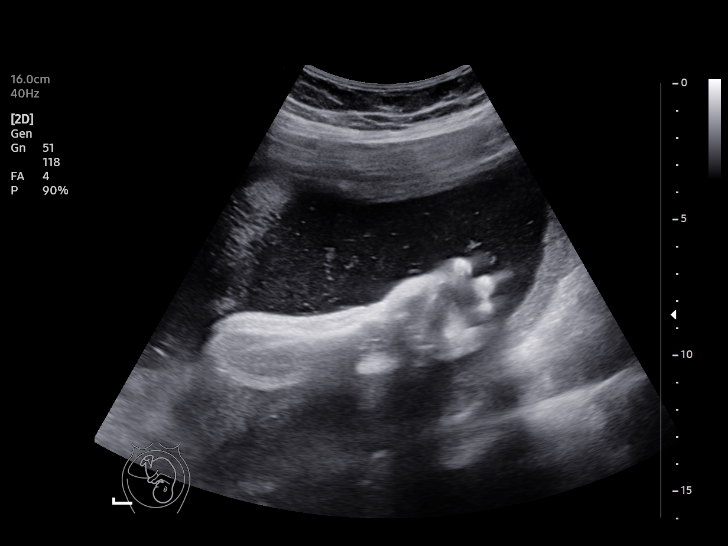
[im 5/14]
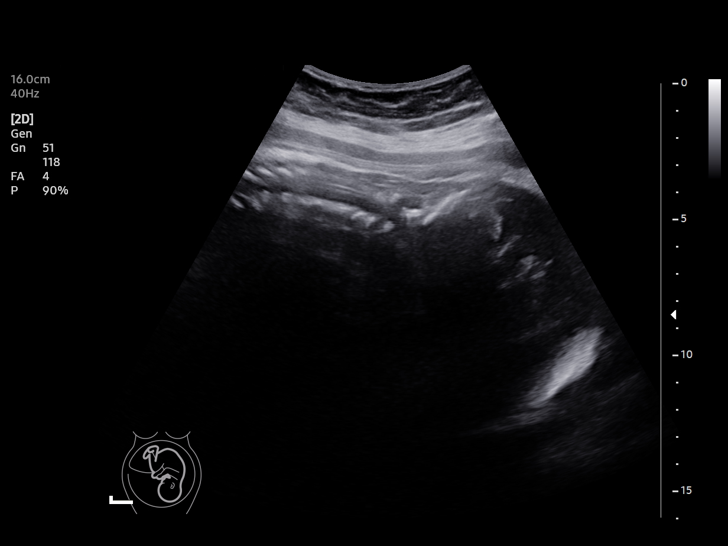
[im 6/14]
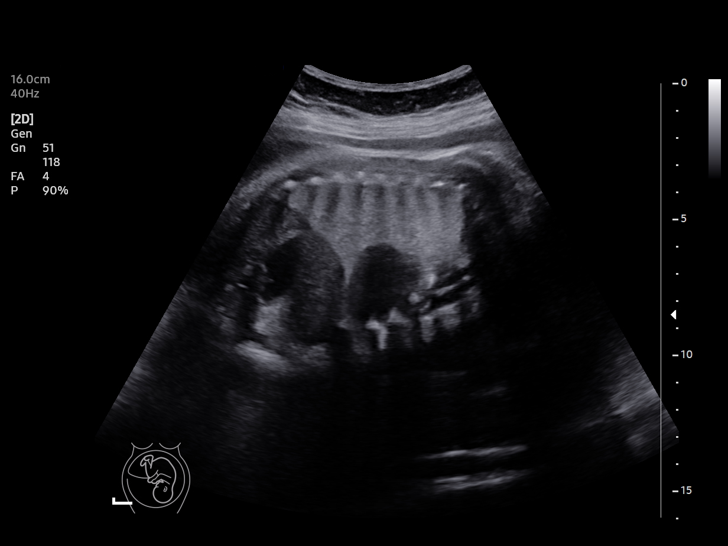
[im 7/14]
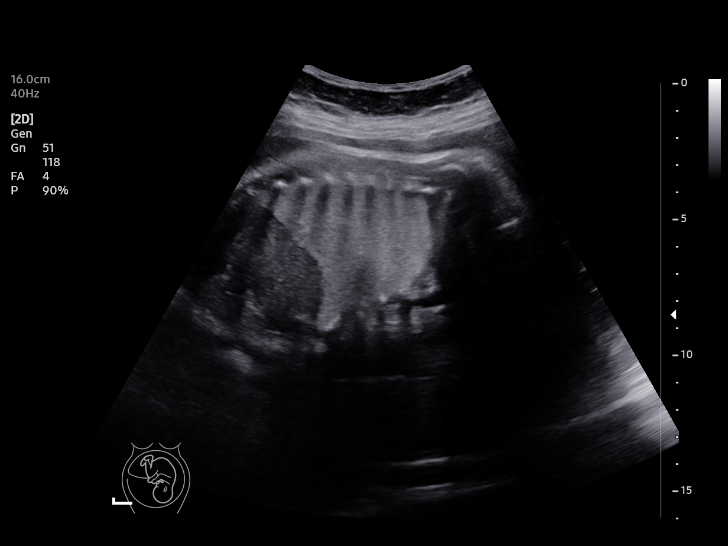
[im 8/14]
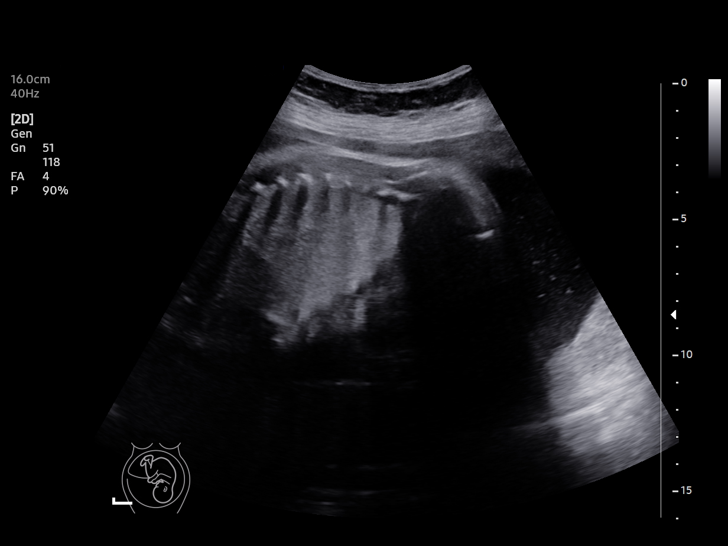
[im 9/14]
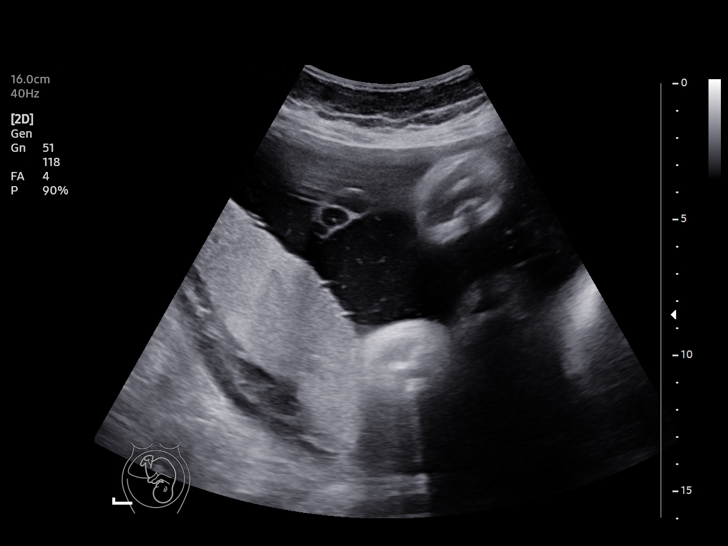
[im 10/14]
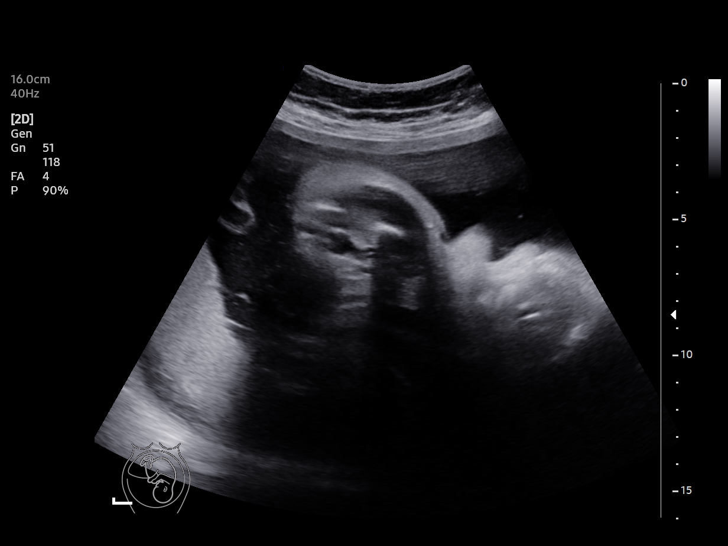
[im 11/14]
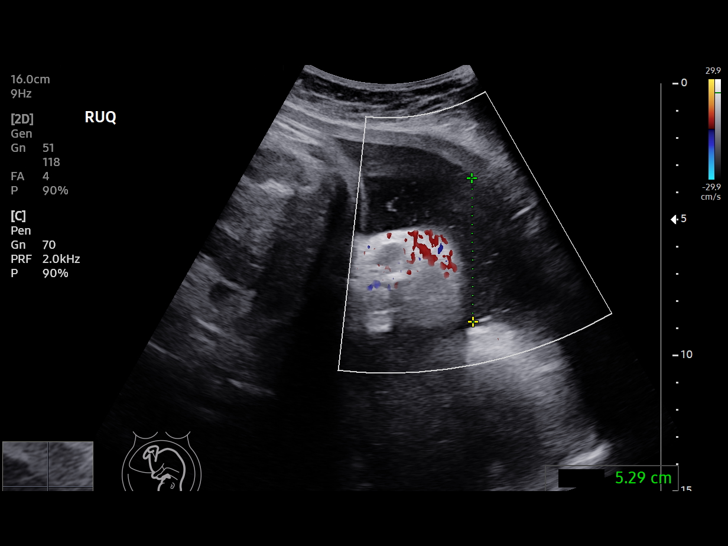
[im 12/14]
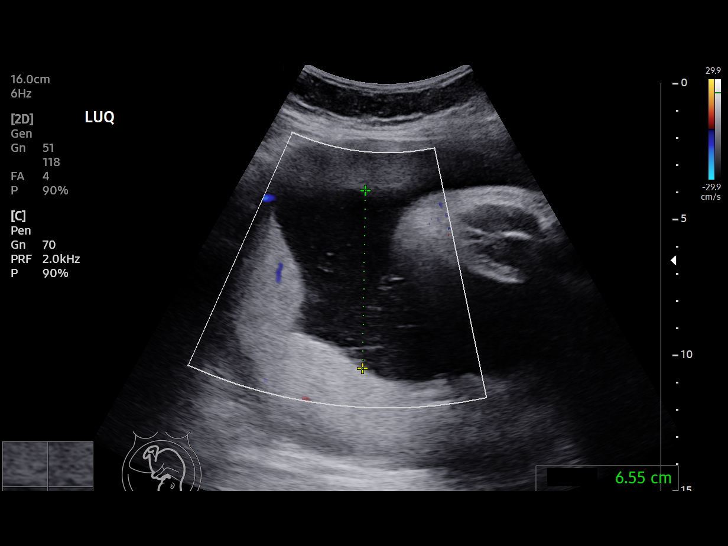
[im 13/14]
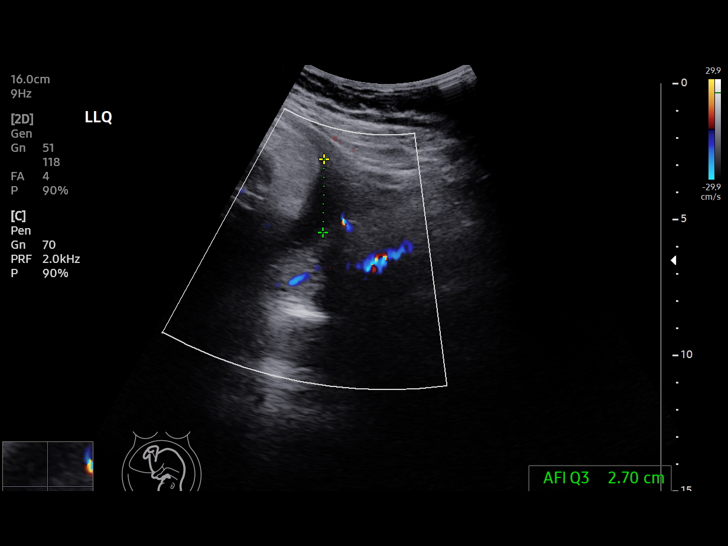
[im 14/14]
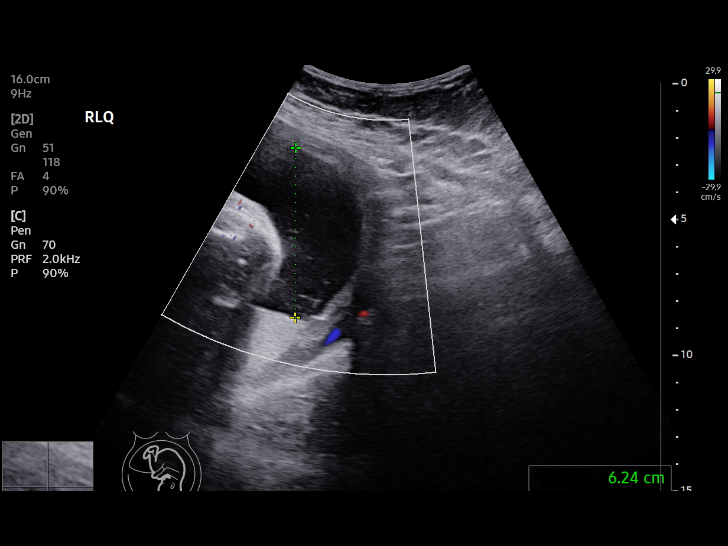

[14 of 14 positions shown; findings below may reference images not displayed]

[REDACTED]care at

 1  US FETAL BPP W/NONSTRESS              76818.4     KEOULENIA DAVIES LAUTIER

Service(s) Provided

Indications

 33 weeks gestation of pregnancy
 Pre-existing diabetes, type 2, in pregnancy,
 third trimester
Fetal Evaluation

 Num Of Fetuses:         1
 Preg. Location:         Intrauterine
 Cardiac Activity:       Observed
 Presentation:           Cephalic

 Amniotic Fluid
 AFI FV:      Within normal limits

 AFI Sum(cm)     %Tile       Largest Pocket(cm)
 20.78           78

 RUQ(cm)       RLQ(cm)       LUQ(cm)        LLQ(cm)

Biophysical Evaluation
 Amniotic F.V:   Pocket => 2 cm             F. Tone:        Observed
 F. Movement:    Observed                   N.S.T:          Reactive
 F. Breathing:   Observed                   Score:          [DATE]
OB History

 Gravidity:    2         Term:   1
 Living:       1
Gestational Age

 LMP:           33w 6d        Date:  09/01/20                 EDD:   06/08/21
 Best:          33w 6d     Det. By:  LMP  (09/01/20)          EDD:   06/08/21
Impression

 Reassuring antenatal testing
Recommendations

 Continue with weekly testing until delivery.
                Tseung, Chitim

## 2022-11-07 ENCOUNTER — Ambulatory Visit: Payer: BC Managed Care – PPO | Admitting: Family Medicine
